# Patient Record
Sex: Female | Born: 1970 | Race: White | Hispanic: No | Marital: Married | Smoking: Never smoker
Health system: Southern US, Community
[De-identification: ages and names within clinical notes are randomized; demographics above are authoritative.]

## PROBLEM LIST (undated history)

## (undated) DIAGNOSIS — Z780 Asymptomatic menopausal state: Secondary | ICD-10-CM

## (undated) DIAGNOSIS — Z9889 Other specified postprocedural states: Secondary | ICD-10-CM

## (undated) DIAGNOSIS — E7800 Pure hypercholesterolemia, unspecified: Secondary | ICD-10-CM

## (undated) DIAGNOSIS — R87629 Unspecified abnormal cytological findings in specimens from vagina: Secondary | ICD-10-CM

## (undated) DIAGNOSIS — T8859XA Other complications of anesthesia, initial encounter: Secondary | ICD-10-CM

## (undated) HISTORY — PX: BREAST EXCISIONAL BIOPSY: SUR124

## (undated) HISTORY — PX: LEEP: SHX91

## (undated) HISTORY — DX: Pure hypercholesterolemia, unspecified: E78.00

## (undated) HISTORY — DX: Asymptomatic menopausal state: Z78.0

## (undated) HISTORY — DX: Unspecified abnormal cytological findings in specimens from vagina: R87.629

## (undated) HISTORY — PX: BREAST BIOPSY: SHX20

---

## 2000-07-21 HISTORY — PX: TONSILLECTOMY: SUR1361

## 2002-07-21 HISTORY — PX: ABDOMINAL HYSTERECTOMY: SHX81

## 2004-05-15 ENCOUNTER — Ambulatory Visit: Payer: Self-pay | Admitting: Otolaryngology

## 2005-12-09 ENCOUNTER — Ambulatory Visit: Payer: Self-pay | Admitting: Obstetrics and Gynecology

## 2006-05-06 ENCOUNTER — Ambulatory Visit: Payer: Self-pay | Admitting: Unknown Physician Specialty

## 2006-05-15 ENCOUNTER — Ambulatory Visit: Payer: Self-pay | Admitting: Unknown Physician Specialty

## 2007-11-09 ENCOUNTER — Ambulatory Visit: Payer: Self-pay | Admitting: Obstetrics and Gynecology

## 2010-08-08 ENCOUNTER — Ambulatory Visit: Payer: Self-pay | Admitting: Obstetrics and Gynecology

## 2011-09-19 ENCOUNTER — Ambulatory Visit: Payer: Self-pay | Admitting: Obstetrics and Gynecology

## 2013-03-11 ENCOUNTER — Ambulatory Visit: Payer: Self-pay | Admitting: Obstetrics and Gynecology

## 2014-06-30 LAB — HM PAP SMEAR

## 2014-07-10 ENCOUNTER — Ambulatory Visit: Payer: Self-pay | Admitting: Obstetrics and Gynecology

## 2015-07-02 ENCOUNTER — Encounter: Payer: Self-pay | Admitting: *Deleted

## 2015-07-04 ENCOUNTER — Other Ambulatory Visit: Payer: Self-pay | Admitting: Physician Assistant

## 2015-07-04 DIAGNOSIS — K594 Anal spasm: Secondary | ICD-10-CM

## 2015-07-04 DIAGNOSIS — R109 Unspecified abdominal pain: Secondary | ICD-10-CM

## 2015-07-06 ENCOUNTER — Encounter: Payer: Self-pay | Admitting: Obstetrics and Gynecology

## 2015-07-06 ENCOUNTER — Ambulatory Visit: Payer: 59

## 2015-07-12 ENCOUNTER — Ambulatory Visit: Admission: RE | Admit: 2015-07-12 | Payer: 59 | Source: Ambulatory Visit

## 2015-07-19 ENCOUNTER — Ambulatory Visit: Admission: RE | Admit: 2015-07-19 | Payer: 59 | Source: Ambulatory Visit

## 2015-07-20 ENCOUNTER — Encounter: Payer: Self-pay | Admitting: Obstetrics and Gynecology

## 2015-07-20 ENCOUNTER — Ambulatory Visit (INDEPENDENT_AMBULATORY_CARE_PROVIDER_SITE_OTHER): Payer: 59 | Admitting: Obstetrics and Gynecology

## 2015-07-20 ENCOUNTER — Telehealth: Payer: Self-pay | Admitting: Obstetrics and Gynecology

## 2015-07-20 VITALS — BP 116/72 | HR 70 | Ht 61.0 in | Wt 143.8 lb

## 2015-07-20 DIAGNOSIS — Z01419 Encounter for gynecological examination (general) (routine) without abnormal findings: Secondary | ICD-10-CM | POA: Diagnosis not present

## 2015-07-20 MED ORDER — ESTRADIOL ACETATE 0.05 MG/24HR VA RING: 1.0000 | VAGINAL_RING | Freq: Once | VAGINAL | Status: DC

## 2015-07-20 MED ORDER — SIMVASTATIN 20 MG PO TABS: 20.0000 mg | ORAL_TABLET | Freq: Every day | ORAL | Status: DC

## 2015-07-20 NOTE — Telephone Encounter (Signed)
FORGOT TO ASK U A QUESTION WHEN SHE WAS HERE. Amanda Adams 4 TUBES OF BLOOD AND SHE WANTS TO SEE IF ITS TOO LATE TO SEE IF SHE HAS ARTHRITIS.

## 2015-07-20 NOTE — Telephone Encounter (Signed)
She also forgot to talk to you about she feels like she is getting adult ADHD, she would like referral to [facility]

## 2015-07-20 NOTE — Progress Notes (Signed)
Subjective:   Amanda Adams is a 44 y.o. G37P0 Caucasian female here for a routine well-woman exam.  No LMP recorded. Patient has had a hysterectomy.    Current complaints: none PCP: me       Does need labs  Social History: Sexual: heterosexual Marital Status: married Living situation: with spouse Occupation: unknown occupation Tobacco/alcohol: no tobacco use Illicit drugs: no history of illicit drug use  The following portions of the patient's history were reviewed and updated as appropriate: allergies, current medications, past family history, past medical history, past social history, past surgical history and problem list.  Past Medical History Past Medical History  Diagnosis Date  . Vaginal Pap smear, abnormal   . Postmenopausal   . High cholesterol     Past Surgical History Past Surgical History  Procedure Laterality Date  . Abdominal hysterectomy  2004  . Tonsillectomy  2002  . Leep    . Cesarean section      Gynecologic History G2P0  No LMP recorded. Patient has had a hysterectomy. Contraception: post menopausal status Last Pap: 2015. Results were: normal Last mammogram: 2015. Results were: normal  Obstetric History OB History  Gravida Para Term Preterm AB SAB TAB Ectopic Multiple Living  2         2    # Outcome Date GA Lbr Len/2nd Weight Sex Delivery Anes PTL Lv  2 Gravida 1997    F CS-Unspec   Y  1 Gravida 1995    M CS-Unspec   Y      Current Medications No current outpatient prescriptions on file prior to visit.   No current facility-administered medications on file prior to visit.    Review of Systems Patient denies any headaches, blurred vision, shortness of breath, chest pain, abdominal pain, problems with bowel movements, urination, or intercourse.  Objective:  BP 116/72 mmHg  Pulse 70  Ht 5\' 1"  (1.549 m)  Wt 143 lb 12.8 oz (65.227 kg)  BMI 27.18 kg/m2 Physical Exam  General:  Well developed, well nourished, no acute distress. She is  alert and oriented x3. Skin:  Warm and dry Neck:  Midline trachea, no thyromegaly or nodules Cardiovascular: Regular rate and rhythm, no murmur heard Lungs:  Effort normal, all lung fields clear to auscultation bilaterally Breasts:  No dominant palpable mass, retraction, or nipple discharge Abdomen:  Soft, non tender, no hepatosplenomegaly or masses Pelvic:  External genitalia is normal in appearance.  The vagina is normal in appearance. The cervix is bulbous, no CMT.  Thin prep pap is not done . Uterus is felt to be normal size, shape, and contour.  No adnexal masses or tenderness noted. Extremities:  No swelling or varicosities noted Psych:  She has a normal mood and affect  Assessment:   Healthy well-woman exam Hyperlipidemia PMHRT fatigue  Plan:  Labs obtained F/U 1 year for AE, or sooner if needed Mammogram scheduled  Corydon Schweiss Rockney Ghee, CNM

## 2015-07-20 NOTE — Patient Instructions (Signed)
  Place annual gynecologic exam patient instructions here.  Thank you for enrolling in [facility]. Please follow the instructions below to securely access your online medical record. MyChart allows you to send messages to your doctor, view your test results, manage appointments, and more.   How Do I Sign Up? 1. In your Internet browser, go to [employer] and enter [email or web address]. 2. Click on the Sign Up Now link in the Sign In box. You will see the New Member Sign Up page. 3. Enter your MyChart Access Code exactly as it appears below. You will not need to use this code after you've completed the sign-up process. If you do not sign up before the expiration date, you must request a new code.  MyChart Access Code: [email or web address] Expires: 09/01/2015  2:00 PM  4. Enter your Social Security Number ([id number]) and Date of Birth (mm/dd/yyyy) as indicated and click Submit. You will be taken to the next sign-up page. 5. Create a MyChart ID. This will be your MyChart login ID and cannot be changed, so think of one that is secure and easy to remember. 6. Create a MyChart password. You can change your password at any time. 7. Enter your Password Reset Question and Answer. This can be used at a later time if you forget your password.  8. Enter your e-mail address. You will receive e-mail notification when new information is available in [place]. 9. Click Sign Up. You can now view your medical record.   Additional Information Remember, MyChart is NOT to be used for urgent needs. For medical emergencies, dial 911.

## 2015-07-20 NOTE — Telephone Encounter (Signed)
pls advise

## 2015-07-20 NOTE — Telephone Encounter (Signed)
Check with Amy to see if she has enough to add RA factor?

## 2015-07-21 LAB — CBC
HEMOGLOBIN: 12.6 g/dL (ref 11.1–15.9)
Hematocrit: 37.4 % (ref 34.0–46.6)
MCH: 29.4 pg (ref 26.6–33.0)
MCHC: 33.7 g/dL (ref 31.5–35.7)
MCV: 87 fL (ref 79–97)
Platelets: 279 10*3/uL (ref 150–379)
RBC: 4.29 x10E6/uL (ref 3.77–5.28)
RDW: 13.5 % (ref 12.3–15.4)
WBC: 4.1 10*3/uL (ref 3.4–10.8)

## 2015-07-21 LAB — NMR, LIPOPROFILE
CHOLESTEROL: 294 mg/dL — AB (ref 100–199)
HDL CHOLESTEROL BY NMR: 94 mg/dL (ref >39)
HDL Particle Number: 41.5 umol/L (ref >=30.5)
LDL PARTICLE NUMBER: 1561 nmol/L — AB (ref <1000)
LDL SIZE: 22.3 nm (ref >20.5)
LDL-C: 185 mg/dL — AB (ref 0–99)
LP-IR Score: <25 (ref <=45)
Small LDL Particle Number: <90 nmol/L (ref <=527)
TRIGLYCERIDES BY NMR: 76 mg/dL (ref 0–149)

## 2015-07-21 LAB — THYROID PANEL WITH TSH
Free Thyroxine Index: 2.1 (ref 1.2–4.9)
T3 Uptake Ratio: 31 % (ref 24–39)
T4, Total: 6.7 ug/dL (ref 4.5–12.0)
TSH: 2.23 u[IU]/mL (ref 0.450–4.500)

## 2015-07-21 LAB — VITAMIN B12: Vitamin B-12: 237 pg/mL (ref 211–946)

## 2015-07-21 LAB — RHEUMATOID FACTOR

## 2015-07-21 LAB — VITAMIN D 25 HYDROXY (VIT D DEFICIENCY, FRACTURES): Vit D, 25-Hydroxy: 36 ng/mL (ref 30.0–100.0)

## 2015-07-21 LAB — IRON: Iron: 94 ug/dL (ref 27–159)

## 2015-07-25 ENCOUNTER — Other Ambulatory Visit: Payer: Self-pay | Admitting: Obstetrics and Gynecology

## 2015-07-25 DIAGNOSIS — E785 Hyperlipidemia, unspecified: Secondary | ICD-10-CM

## 2015-07-25 MED ORDER — SIMVASTATIN 40 MG PO TABS: 20.0000 mg | ORAL_TABLET | Freq: Every day | ORAL | Status: DC

## 2015-07-25 MED ORDER — RED YEAST RICE 600 MG PO TABS: 1.0000 | ORAL_TABLET | Freq: Every morning | ORAL | Status: DC

## 2015-07-25 MED ORDER — OMEGA 3 1000 MG PO CAPS: 1.0000 | ORAL_CAPSULE | Freq: Every morning | ORAL | Status: DC

## 2015-07-25 MED ORDER — GARLIC 1000 MG PO CAPS: 1.0000 | ORAL_CAPSULE | Freq: Every morning | ORAL | Status: DC

## 2015-07-25 NOTE — Telephone Encounter (Signed)
Can you enter referral into epic.Please.Thanks

## 2015-07-25 NOTE — Telephone Encounter (Signed)
Please send referral to [facility] as patient requested

## 2015-07-31 NOTE — Telephone Encounter (Signed)
Mailed pt copy of all of her lab results

## 2015-07-31 NOTE — Telephone Encounter (Signed)
-----   Message from [place], [place] sent at 07/25/2015 10:44 AM EST ----- Please let her know all labs were normal except cholesterol- it is actually higher  I sent in a new dose on cholesterol medication and also sent in rx for OTC red yeast rice, Omega 3 & garlic tablets- to take one of each daily, studies have shown it works well to reduce cholesterol.  i want to recheck her levels fasting in three months, order is in computer.

## 2015-08-07 ENCOUNTER — Other Ambulatory Visit: Payer: Self-pay | Admitting: Obstetrics and Gynecology

## 2015-08-07 DIAGNOSIS — R41840 Attention and concentration deficit: Secondary | ICD-10-CM | POA: Insufficient documentation

## 2015-08-07 NOTE — Telephone Encounter (Signed)
Referral placed today

## 2015-10-07 DIAGNOSIS — C539 Malignant neoplasm of cervix uteri, unspecified: Secondary | ICD-10-CM | POA: Insufficient documentation

## 2015-10-07 DIAGNOSIS — B009 Herpesviral infection, unspecified: Secondary | ICD-10-CM | POA: Insufficient documentation

## 2016-03-13 ENCOUNTER — Other Ambulatory Visit: Payer: Self-pay | Admitting: Obstetrics and Gynecology

## 2016-03-13 ENCOUNTER — Ambulatory Visit
Admission: RE | Admit: 2016-03-13 | Discharge: 2016-03-13 | Disposition: A | Discharge status: Home / Self Care | Payer: 59 | Source: Ambulatory Visit | Attending: Obstetrics and Gynecology | Admitting: Obstetrics and Gynecology

## 2016-03-13 DIAGNOSIS — Z01419 Encounter for gynecological examination (general) (routine) without abnormal findings: Secondary | ICD-10-CM

## 2016-03-13 DIAGNOSIS — Z1231 Encounter for screening mammogram for malignant neoplasm of breast: Secondary | ICD-10-CM | POA: Insufficient documentation

## 2016-03-21 ENCOUNTER — Telehealth: Payer: Self-pay | Admitting: Obstetrics and Gynecology

## 2016-03-21 NOTE — Telephone Encounter (Signed)
Question about mammogram results she got in the mail.

## 2016-03-25 NOTE — Telephone Encounter (Signed)
Spoke with pt about mammo

## 2016-07-15 ENCOUNTER — Other Ambulatory Visit: Payer: Self-pay | Admitting: Obstetrics and Gynecology

## 2016-07-22 ENCOUNTER — Ambulatory Visit (INDEPENDENT_AMBULATORY_CARE_PROVIDER_SITE_OTHER): Payer: 59 | Admitting: Obstetrics and Gynecology

## 2016-07-22 ENCOUNTER — Encounter: Payer: Self-pay | Admitting: Obstetrics and Gynecology

## 2016-07-22 VITALS — BP 92/64 | HR 75 | Ht 61.0 in | Wt 147.7 lb

## 2016-07-22 DIAGNOSIS — E785 Hyperlipidemia, unspecified: Secondary | ICD-10-CM

## 2016-07-22 DIAGNOSIS — Z01419 Encounter for gynecological examination (general) (routine) without abnormal findings: Secondary | ICD-10-CM

## 2016-07-22 NOTE — Progress Notes (Signed)
Subjective:   Amanda Adams is a 46 y.o. G73P0 Caucasian female here for a routine well-woman exam.  No LMP recorded. Patient has had a hysterectomy.    Current complaints: doing well PCP: Gauger       doesn't desire labs  Social History: Sexual: heterosexual Marital Status: married Living situation: with family Occupation: self-employed Tobacco/alcohol: no tobacco use Illicit drugs: no history of illicit drug use  The following portions of the patient's history were reviewed and updated as appropriate: allergies, current medications, past family history, past medical history, past social history, past surgical history and problem list.  Past Medical History Past Medical History:  Diagnosis Date  . High cholesterol   . Postmenopausal   . Vaginal Pap smear, abnormal     Past Surgical History Past Surgical History:  Procedure Laterality Date  . ABDOMINAL HYSTERECTOMY  2004  . BREAST BIOPSY Right    negative years ago  . CESAREAN SECTION    . LEEP    . TONSILLECTOMY  2002    Gynecologic History G2P0  No LMP recorded. Patient has had a hysterectomy. Contraception: status post hysterectomy Last Pap: 2015. Results were: normal Last mammogram: 2016. Results were: normal   Obstetric History OB History  Gravida Para Term Preterm AB Living  2         2  SAB TAB Ectopic Multiple Live Births          2    # Outcome Date GA Lbr Len/2nd Weight Sex Delivery Anes PTL Lv  2 Gravida 1997    F CS-Unspec   LIV  1 Gravida 1995    M CS-Unspec   LIV      Current Medications Current Outpatient Prescriptions on File Prior to Visit  Medication Sig Dispense Refill  . simvastatin (ZOCOR) 40 MG tablet Take 0.5 tablets (20 mg total) by mouth daily. 90 tablet 4  . Garlic 123XX123 MG CAPS Take 1 capsule (1,000 mg total) by mouth every morning. (Patient not taking: Reported on 07/22/2016) 120 capsule 4  . Omega 3 1000 MG CAPS Take 1 capsule (1,000 mg total) by mouth every morning. (Patient not  taking: Reported on 07/22/2016) 120 each 4  . Red Yeast Rice 600 MG TABS Take 1 tablet (600 mg total) by mouth every morning. (Patient not taking: Reported on 07/22/2016) 120 tablet 4   No current facility-administered medications on file prior to visit.     Review of Systems Patient denies any headaches, blurred vision, shortness of breath, chest pain, abdominal pain, problems with bowel movements, urination, or intercourse.  Objective:  BP 92/64   Pulse 75   Ht 5\' 1"  (1.549 m)   Wt 147 lb 11.2 oz (67 kg)   BMI 27.91 kg/m  Physical Exam  General:  Well developed, well nourished, no acute distress. She is alert and oriented x3. Skin:  Warm and dry Neck:  Midline trachea, no thyromegaly or nodules Cardiovascular: Regular rate and rhythm, no murmur heard Lungs:  Effort normal, all lung fields clear to auscultation bilaterally Breasts:  No dominant palpable mass, retraction, or nipple discharge Abdomen:  Soft, non tender, no hepatosplenomegaly or masses Pelvic:  External genitalia is normal in appearance.  The vagina is normal in appearance. The cervix is bulbous, no CMT.  Thin prep pap is not done  . Uterus is .  No adnexal masses or tenderness. Extremities:  No swelling or varicosities noted Psych:  She has a normal mood and affect  Assessment:  Healthy well-woman exam hyperlipidemia  Plan:   F/U 1 year for AE, or sooner if needed Mammogram ordered  Tera Pellicane Rockney Ghee, CNM

## 2016-09-03 DIAGNOSIS — D239 Other benign neoplasm of skin, unspecified: Secondary | ICD-10-CM | POA: Diagnosis not present

## 2017-04-06 ENCOUNTER — Ambulatory Visit
Admission: RE | Admit: 2017-04-06 | Discharge: 2017-04-06 | Disposition: A | Discharge status: Home / Self Care | Payer: 59 | Source: Ambulatory Visit | Attending: Obstetrics and Gynecology | Admitting: Obstetrics and Gynecology

## 2017-04-06 DIAGNOSIS — Z01419 Encounter for gynecological examination (general) (routine) without abnormal findings: Secondary | ICD-10-CM

## 2017-04-06 DIAGNOSIS — Z1231 Encounter for screening mammogram for malignant neoplasm of breast: Secondary | ICD-10-CM | POA: Insufficient documentation

## 2017-04-13 ENCOUNTER — Telehealth: Payer: Self-pay | Admitting: Obstetrics and Gynecology

## 2017-04-13 NOTE — Telephone Encounter (Signed)
Notified pt. 

## 2017-04-13 NOTE — Telephone Encounter (Signed)
Patient called requesting mammo results

## 2017-07-16 ENCOUNTER — Other Ambulatory Visit: Payer: Self-pay | Admitting: Obstetrics and Gynecology

## 2017-07-16 ENCOUNTER — Telehealth: Payer: Self-pay | Admitting: Obstetrics and Gynecology

## 2017-07-16 NOTE — Telephone Encounter (Signed)
The patient called and stated that she would like to have her prescription Estradiol Acetate ([place]) 0.05 MG/24HR RING refilled before her Annual exam on 07/24/17. No other information was disclosed. Please advise. The patient would like a call back as well.

## 2017-07-16 NOTE — Telephone Encounter (Signed)
Done-ac 

## 2017-07-24 ENCOUNTER — Encounter: Payer: 59 | Admitting: Obstetrics and Gynecology

## 2017-09-03 DIAGNOSIS — L578 Other skin changes due to chronic exposure to nonionizing radiation: Secondary | ICD-10-CM | POA: Diagnosis not present

## 2017-09-03 DIAGNOSIS — Z859 Personal history of malignant neoplasm, unspecified: Secondary | ICD-10-CM | POA: Diagnosis not present

## 2017-09-03 DIAGNOSIS — D2330 Other benign neoplasm of skin of unspecified part of face: Secondary | ICD-10-CM | POA: Diagnosis not present

## 2017-10-07 ENCOUNTER — Ambulatory Visit (INDEPENDENT_AMBULATORY_CARE_PROVIDER_SITE_OTHER): Payer: 59 | Admitting: Obstetrics and Gynecology

## 2017-10-07 ENCOUNTER — Encounter: Payer: Self-pay | Admitting: Obstetrics and Gynecology

## 2017-10-07 ENCOUNTER — Other Ambulatory Visit: Payer: Self-pay | Admitting: Obstetrics and Gynecology

## 2017-10-07 VITALS — BP 108/65 | HR 65 | Ht 61.0 in | Wt 147.1 lb

## 2017-10-07 DIAGNOSIS — Z01419 Encounter for gynecological examination (general) (routine) without abnormal findings: Secondary | ICD-10-CM | POA: Diagnosis not present

## 2017-10-07 MED ORDER — ESTRADIOL ACETATE 0.05 MG/24HR VA RING: 1.0000 | VAGINAL_RING | Freq: Once | VAGINAL | 4 refills | Status: AC

## 2017-10-07 NOTE — Addendum Note (Signed)
Addended by: Keturah Barre L on: 10/07/2017 12:01 PM   Modules accepted: Orders

## 2017-10-07 NOTE — Progress Notes (Signed)
Subjective:   Amanda Adams is a 47 y.o. G53P0 Caucasian female here for a routine well-woman exam.  No LMP recorded. Patient has had a hysterectomy.    Current complaints: none PCP: Gauger       does desire labs  Social History: Sexual: heterosexual Marital Status: married Living situation: with family Occupation: self-employed Tobacco/alcohol: no tobacco use Illicit drugs: no history of illicit drug use  The following portions of the patient's history were reviewed and updated as appropriate: allergies, current medications, past family history, past medical history, past social history, past surgical history and problem list.  Past Medical History Past Medical History:  Diagnosis Date  . High cholesterol   . Postmenopausal   . Vaginal Pap smear, abnormal     Past Surgical History Past Surgical History:  Procedure Laterality Date  . ABDOMINAL HYSTERECTOMY  2004  . BREAST BIOPSY Right    negative years ago  . CESAREAN SECTION    . LEEP    . TONSILLECTOMY  2002    Gynecologic History G2P0  No LMP recorded. Patient has had a hysterectomy. Contraception: status post hysterectomy Last Pap: 2015. Results were: normal Last mammogram: 03/2017. Results were: normal   Obstetric History OB History  Gravida Para Term Preterm AB Living  2         2  SAB TAB Ectopic Multiple Live Births          2    # Outcome Date GA Lbr Len/2nd Weight Sex Delivery Anes PTL Lv  2 Gravida 1997    F CS-Unspec   LIV  1 Gravida 1995    M CS-Unspec   LIV      Current Medications Current Outpatient Medications on File Prior to Visit  Medication Sig Dispense Refill  . Estradiol Acetate (FEMRING) 0.05 MG/24HR RING Place vaginally.    . simvastatin (ZOCOR) 40 MG tablet Take 0.5 tablets (20 mg total) by mouth daily. (Patient not taking: Reported on 10/07/2017) 90 tablet 4   No current facility-administered medications on file prior to visit.     Review of Systems Patient denies any  headaches, blurred vision, shortness of breath, chest pain, abdominal pain, problems with bowel movements, urination, or intercourse.  Objective:  BP 108/65   Pulse 65   Ht 5\' 1"  (1.549 m)   Wt 147 lb 1.6 oz (66.7 kg)   BMI 27.79 kg/m  Physical Exam  General:  Well developed, well nourished, no acute distress. She is alert and oriented x3. Skin:  Warm and dry Neck:  Midline trachea, no thyromegaly or nodules Cardiovascular: Regular rate and rhythm, no murmur heard Lungs:  Effort normal, all lung fields clear to auscultation bilaterally Breasts:  No dominant palpable mass, retraction, or nipple discharge Abdomen:  Soft, non tender, no hepatosplenomegaly or masses Pelvic:  External genitalia is normal in appearance.  The vagina is normal in appearance. The cervix is bulbous, no CMT.  Thin prep pap is done with HR HPV cotesting. Uterus is felt to be normal size, shape, and contour.  No adnexal masses or tenderness noted. Extremities:  No swelling or varicosities noted Psych:  She has a normal mood and affect  Assessment:   Healthy well-woman exam ERT  Plan:  Labs obtained- will follow up accordingly. F/U 1 year for AE, or sooner if needed Mammogram ordered  Debby Clyne Rockney Ghee, CNM

## 2017-10-08 LAB — COMPREHENSIVE METABOLIC PANEL
ALBUMIN: 4.8 g/dL (ref 3.5–5.5)
ALT: 18 IU/L (ref 0–32)
AST: 25 IU/L (ref 0–40)
Albumin/Globulin Ratio: 1.9 (ref 1.2–2.2)
Alkaline Phosphatase: 59 IU/L (ref 39–117)
BILIRUBIN TOTAL: 0.7 mg/dL (ref 0.0–1.2)
BUN / CREAT RATIO: 11 (ref 9–23)
BUN: 10 mg/dL (ref 6–24)
CHLORIDE: 102 mmol/L (ref 96–106)
CO2: 24 mmol/L (ref 20–29)
CREATININE: 0.88 mg/dL (ref 0.57–1.00)
Calcium: 9.7 mg/dL (ref 8.7–10.2)
GFR calc Af Amer: 90 mL/min/{1.73_m2} (ref >59)
GFR calc non Af Amer: 78 mL/min/{1.73_m2} (ref >59)
GLUCOSE: 94 mg/dL (ref 65–99)
Globulin, Total: 2.5 g/dL (ref 1.5–4.5)
Potassium: 5.2 mmol/L (ref 3.5–5.2)
Sodium: 141 mmol/L (ref 134–144)
Total Protein: 7.3 g/dL (ref 6.0–8.5)

## 2017-10-08 LAB — CYTOLOGY - PAP

## 2017-10-08 LAB — LIPID PANEL
Chol/HDL Ratio: 3 ratio (ref 0.0–4.4)
Cholesterol, Total: 269 mg/dL — ABNORMAL HIGH (ref 100–199)
HDL: 91 mg/dL (ref >39)
LDL Calculated: 158 mg/dL — ABNORMAL HIGH (ref 0–99)
Triglycerides: 98 mg/dL (ref 0–149)
VLDL Cholesterol Cal: 20 mg/dL (ref 5–40)

## 2017-10-08 LAB — HEMOGLOBIN A1C
ESTIMATED AVERAGE GLUCOSE: 100 mg/dL
HEMOGLOBIN A1C: 5.1 % (ref 4.8–5.6)

## 2017-10-08 LAB — VITAMIN B12: Vitamin B-12: 234 pg/mL (ref 232–1245)

## 2017-10-08 LAB — VITAMIN D 25 HYDROXY (VIT D DEFICIENCY, FRACTURES): Vit D, 25-Hydroxy: 42.2 ng/mL (ref 30.0–100.0)

## 2017-10-15 ENCOUNTER — Encounter: Payer: Self-pay | Admitting: Obstetrics and Gynecology

## 2017-10-19 ENCOUNTER — Telehealth: Payer: Self-pay | Admitting: Obstetrics and Gynecology

## 2017-10-19 ENCOUNTER — Other Ambulatory Visit: Payer: Self-pay | Admitting: *Deleted

## 2017-10-19 ENCOUNTER — Ambulatory Visit (INDEPENDENT_AMBULATORY_CARE_PROVIDER_SITE_OTHER): Payer: 59 | Admitting: Obstetrics and Gynecology

## 2017-10-19 VITALS — BP 103/51 | HR 62 | Wt 146.2 lb

## 2017-10-19 DIAGNOSIS — E538 Deficiency of other specified B group vitamins: Secondary | ICD-10-CM

## 2017-10-19 MED ORDER — SIMVASTATIN 40 MG PO TABS: 20.0000 mg | ORAL_TABLET | Freq: Every day | ORAL | 4 refills | Status: DC

## 2017-10-19 NOTE — Progress Notes (Signed)
Pt is here for a B12 injection due .

## 2017-10-19 NOTE — Telephone Encounter (Signed)
The patient called and stated that she needs a refill of her medication simvastatin (ZOCOR) 40 MG tablet, No other information was disclosed. Please advise.

## 2017-10-19 NOTE — Telephone Encounter (Signed)
Done-ac 

## 2017-10-20 MED ORDER — CYANOCOBALAMIN 1000 MCG/ML IJ SOLN
1000.0000 ug | Freq: Once | INTRAMUSCULAR | Status: AC
  Administered 2017-10-19: 1000 ug via INTRAMUSCULAR

## 2017-10-27 ENCOUNTER — Encounter: Payer: Self-pay | Admitting: Obstetrics and Gynecology

## 2017-11-16 ENCOUNTER — Encounter: Payer: Self-pay | Admitting: Obstetrics and Gynecology

## 2017-11-16 ENCOUNTER — Ambulatory Visit (INDEPENDENT_AMBULATORY_CARE_PROVIDER_SITE_OTHER): Payer: 59 | Admitting: Obstetrics and Gynecology

## 2017-11-16 VITALS — BP 108/60 | HR 74 | Wt 147.8 lb

## 2017-11-16 DIAGNOSIS — D649 Anemia, unspecified: Secondary | ICD-10-CM | POA: Diagnosis not present

## 2017-11-16 MED ORDER — CYANOCOBALAMIN 1000 MCG/ML IJ SOLN
1000.0000 ug | Freq: Once | INTRAMUSCULAR | Status: AC
  Administered 2017-11-16: 1000 ug via INTRAMUSCULAR

## 2017-11-16 MED ORDER — CYANOCOBALAMIN 1000 MCG/ML IJ SOLN: 1000.0000 ug | INTRAMUSCULAR | 3 refills | Status: DC

## 2017-11-16 NOTE — Progress Notes (Signed)
Pt is here for b-12 inj, she takes this for fatigue

## 2018-01-19 ENCOUNTER — Encounter: Payer: Self-pay | Admitting: Obstetrics and Gynecology

## 2018-01-19 ENCOUNTER — Other Ambulatory Visit: Payer: Self-pay | Admitting: *Deleted

## 2018-01-19 MED ORDER — ACYCLOVIR 400 MG PO TABS: 400.0000 mg | ORAL_TABLET | Freq: Every day | ORAL | 2 refills | Status: DC

## 2018-01-19 MED ORDER — PENCICLOVIR 1 % EX CREA: 1.0000 "application " | TOPICAL_CREAM | CUTANEOUS | 2 refills | Status: AC

## 2018-02-11 ENCOUNTER — Encounter: Payer: Self-pay | Admitting: Obstetrics and Gynecology

## 2018-02-11 ENCOUNTER — Other Ambulatory Visit: Payer: Self-pay | Admitting: *Deleted

## 2018-02-11 MED ORDER — NEEDLES & SYRINGES MISC: 0 refills | Status: DC

## 2018-03-15 ENCOUNTER — Other Ambulatory Visit: Payer: Self-pay | Admitting: Obstetrics and Gynecology

## 2018-03-15 DIAGNOSIS — Z1231 Encounter for screening mammogram for malignant neoplasm of breast: Secondary | ICD-10-CM

## 2018-04-08 ENCOUNTER — Ambulatory Visit
Admission: RE | Admit: 2018-04-08 | Discharge: 2018-04-08 | Disposition: A | Discharge status: Home / Self Care | Payer: 59 | Source: Ambulatory Visit | Attending: Obstetrics and Gynecology | Admitting: Obstetrics and Gynecology

## 2018-04-08 DIAGNOSIS — Z1231 Encounter for screening mammogram for malignant neoplasm of breast: Secondary | ICD-10-CM | POA: Diagnosis present

## 2018-06-08 ENCOUNTER — Ambulatory Visit: Payer: 59 | Admitting: Podiatry

## 2018-10-11 ENCOUNTER — Telehealth: Payer: Self-pay | Admitting: Obstetrics and Gynecology

## 2018-10-12 ENCOUNTER — Encounter: Payer: 59 | Admitting: Obstetrics and Gynecology

## 2018-10-12 NOTE — Telephone Encounter (Signed)
error 

## 2018-10-15 ENCOUNTER — Encounter: Payer: 59 | Admitting: Obstetrics and Gynecology

## 2018-10-21 ENCOUNTER — Encounter: Payer: 59 | Admitting: Obstetrics and Gynecology

## 2018-10-21 ENCOUNTER — Other Ambulatory Visit: Payer: Self-pay

## 2018-10-21 MED ORDER — ESTRADIOL ACETATE 0.05 MG/24HR VA RING: 1.0000 | VAGINAL_RING | VAGINAL | 3 refills | Status: DC

## 2018-10-22 ENCOUNTER — Other Ambulatory Visit: Payer: Self-pay | Admitting: *Deleted

## 2018-10-26 ENCOUNTER — Other Ambulatory Visit: Payer: Self-pay | Admitting: *Deleted

## 2018-10-26 MED ORDER — ESTRADIOL ACETATE 0.05 MG/24HR VA RING: 1.0000 | VAGINAL_RING | VAGINAL | 3 refills | Status: DC

## 2018-11-17 ENCOUNTER — Other Ambulatory Visit: Payer: Self-pay | Admitting: Obstetrics and Gynecology

## 2018-11-17 DIAGNOSIS — D649 Anemia, unspecified: Secondary | ICD-10-CM

## 2018-12-16 ENCOUNTER — Other Ambulatory Visit: Payer: Self-pay

## 2018-12-16 ENCOUNTER — Ambulatory Visit: Payer: 59 | Admitting: Obstetrics and Gynecology

## 2018-12-16 ENCOUNTER — Encounter: Payer: Self-pay | Admitting: Obstetrics and Gynecology

## 2018-12-16 DIAGNOSIS — R3915 Urgency of urination: Secondary | ICD-10-CM

## 2018-12-16 NOTE — Progress Notes (Unsigned)
Patient called the office with c/o urgency and frequency of urination and asked if she could come in and leave a urine specimen to be sent for culture. Specimen obtained and sent to lab. Patient leave building before this writer could speak with her.

## 2018-12-22 ENCOUNTER — Other Ambulatory Visit: Payer: Self-pay | Admitting: Obstetrics and Gynecology

## 2018-12-22 LAB — URINE CULTURE

## 2018-12-22 MED ORDER — NITROFURANTOIN MONOHYD MACRO 100 MG PO CAPS: 100.0000 mg | ORAL_CAPSULE | Freq: Two times a day (BID) | ORAL | 1 refills | Status: DC

## 2019-01-12 ENCOUNTER — Telehealth: Payer: Self-pay | Admitting: Obstetrics and Gynecology

## 2019-01-12 NOTE — Telephone Encounter (Signed)
Patient called stating she was given instructions from Melody via mychart to drop off another urine specimen to follow up from her visit in may. I dont see any message stated that. Does she need to come in? Please advise. The patient is aware that you have already left for the day. Thanks

## 2019-01-13 ENCOUNTER — Encounter: Payer: Self-pay | Admitting: *Deleted

## 2019-01-13 NOTE — Telephone Encounter (Signed)
Sent pt mcm-ac

## 2019-02-08 ENCOUNTER — Other Ambulatory Visit: Payer: Self-pay

## 2019-02-08 ENCOUNTER — Telehealth: Payer: Self-pay | Admitting: Obstetrics and Gynecology

## 2019-02-08 DIAGNOSIS — E559 Vitamin D deficiency, unspecified: Secondary | ICD-10-CM

## 2019-02-08 DIAGNOSIS — Z0000 Encounter for general adult medical examination without abnormal findings: Secondary | ICD-10-CM

## 2019-02-08 DIAGNOSIS — Z13220 Encounter for screening for lipoid disorders: Secondary | ICD-10-CM

## 2019-02-08 DIAGNOSIS — D518 Other vitamin B12 deficiency anemias: Secondary | ICD-10-CM

## 2019-02-08 DIAGNOSIS — D508 Other iron deficiency anemias: Secondary | ICD-10-CM

## 2019-02-08 NOTE — Telephone Encounter (Signed)
The patient called and stated that she needs to have bloodwork done due to thinking her b-12/ Anemia is low. The patient is aware her provider and nurse are out of the office and is requesting to have a different provider put in orders for labs because she feels that something of off/not right. Please advise.

## 2019-02-09 ENCOUNTER — Other Ambulatory Visit: Payer: 59

## 2019-02-09 ENCOUNTER — Other Ambulatory Visit: Payer: Self-pay

## 2019-02-10 ENCOUNTER — Other Ambulatory Visit: Payer: 59

## 2019-02-12 LAB — COMPREHENSIVE METABOLIC PANEL
ALT: 16 IU/L (ref 0–32)
AST: 23 IU/L (ref 0–40)
Albumin/Globulin Ratio: 2 (ref 1.2–2.2)
Albumin: 4.7 g/dL (ref 3.8–4.8)
Alkaline Phosphatase: 67 IU/L (ref 39–117)
BUN/Creatinine Ratio: 16 (ref 9–23)
BUN: 13 mg/dL (ref 6–24)
Bilirubin Total: 0.6 mg/dL (ref 0.0–1.2)
CO2: 23 mmol/L (ref 20–29)
Calcium: 9.7 mg/dL (ref 8.7–10.2)
Chloride: 103 mmol/L (ref 96–106)
Creatinine, Ser: 0.81 mg/dL (ref 0.57–1.00)
GFR calc Af Amer: 99 mL/min/{1.73_m2} (ref >59)
GFR calc non Af Amer: 86 mL/min/{1.73_m2} (ref >59)
Globulin, Total: 2.4 g/dL (ref 1.5–4.5)
Glucose: 85 mg/dL (ref 65–99)
Potassium: 5.3 mmol/L — ABNORMAL HIGH (ref 3.5–5.2)
Sodium: 143 mmol/L (ref 134–144)
Total Protein: 7.1 g/dL (ref 6.0–8.5)

## 2019-02-12 LAB — CBC
Hematocrit: 38.7 % (ref 34.0–46.6)
Hemoglobin: 12.7 g/dL (ref 11.1–15.9)
MCH: 28.9 pg (ref 26.6–33.0)
MCHC: 32.8 g/dL (ref 31.5–35.7)
MCV: 88 fL (ref 79–97)
Platelets: 274 10*3/uL (ref 150–450)
RBC: 4.39 x10E6/uL (ref 3.77–5.28)
RDW: 12.7 % (ref 11.7–15.4)
WBC: 4.3 10*3/uL (ref 3.4–10.8)

## 2019-02-12 LAB — LIPID PANEL
Chol/HDL Ratio: 2.4 ratio (ref 0.0–4.4)
Cholesterol, Total: 233 mg/dL — ABNORMAL HIGH (ref 100–199)
HDL: 99 mg/dL (ref >39)
LDL Calculated: 118 mg/dL — ABNORMAL HIGH (ref 0–99)
Triglycerides: 80 mg/dL (ref 0–149)
VLDL Cholesterol Cal: 16 mg/dL (ref 5–40)

## 2019-02-12 LAB — VITAMIN D 25 HYDROXY (VIT D DEFICIENCY, FRACTURES): Vit D, 25-Hydroxy: 38.3 ng/mL (ref 30.0–100.0)

## 2019-02-12 LAB — METHYLMALONIC ACID, SERUM: Methylmalonic Acid: 172 nmol/L (ref 0–378)

## 2019-02-14 ENCOUNTER — Telehealth: Payer: Self-pay | Admitting: Obstetrics and Gynecology

## 2019-02-14 NOTE — Telephone Encounter (Signed)
Patient called to check the status of her results, Pt aware Melody is out of office until tomorrow 02/14/19. Pt requesting call back. Please advise.

## 2019-02-15 NOTE — Telephone Encounter (Signed)
MNS sent mcm

## 2019-02-25 ENCOUNTER — Encounter: Payer: Self-pay | Admitting: Obstetrics and Gynecology

## 2019-03-10 ENCOUNTER — Encounter: Payer: Self-pay | Admitting: Obstetrics and Gynecology

## 2019-03-10 ENCOUNTER — Ambulatory Visit (INDEPENDENT_AMBULATORY_CARE_PROVIDER_SITE_OTHER): Payer: 59 | Admitting: Obstetrics and Gynecology

## 2019-03-10 ENCOUNTER — Other Ambulatory Visit: Payer: Self-pay

## 2019-03-10 VITALS — BP 120/78 | HR 98 | Ht 61.0 in | Wt 137.2 lb

## 2019-03-10 DIAGNOSIS — Z01419 Encounter for gynecological examination (general) (routine) without abnormal findings: Secondary | ICD-10-CM | POA: Diagnosis not present

## 2019-03-10 DIAGNOSIS — E785 Hyperlipidemia, unspecified: Secondary | ICD-10-CM | POA: Diagnosis not present

## 2019-03-10 DIAGNOSIS — Z79890 Hormone replacement therapy (postmenopausal): Secondary | ICD-10-CM

## 2019-03-10 DIAGNOSIS — Z5181 Encounter for therapeutic drug level monitoring: Secondary | ICD-10-CM

## 2019-03-10 MED ORDER — ACYCLOVIR 400 MG PO TABS: 400.0000 mg | ORAL_TABLET | Freq: Every day | ORAL | 2 refills | Status: AC

## 2019-03-10 MED ORDER — SIMVASTATIN 40 MG PO TABS: 20.0000 mg | ORAL_TABLET | Freq: Every day | ORAL | 4 refills | Status: DC

## 2019-03-10 MED ORDER — ESTRADIOL ACETATE 0.05 MG/24HR VA RING: 1.0000 | VAGINAL_RING | VAGINAL | 3 refills | Status: DC

## 2019-03-10 NOTE — Progress Notes (Signed)
  SUBJECTIVE:  48 y.o. female for annual routine checkup with no concerns.  Pt sexually active with single female partner. No concern for STIs. No LMP on record s/p total hysterectomy.   Social History: Sexual: heterosexual Marital Status: married Living situation: with family Occupation: self-employed Tobacco/alcohol: no tobacco use Illicit drugs: no history of illicit drug use  Current Outpatient Medications  Medication Sig Dispense Refill  . acyclovir (ZOVIRAX) 400 MG tablet Take 1 tablet (400 mg total) by mouth 5 (five) times daily. 35 tablet 2  . cyanocobalamin (,VITAMIN B-12,) 1000 MCG/ML injection INJECT 1 ML (1,000 MCG TOTAL) INTO THE MUSCLE EVERY 30 (THIRTY) DAYS. 1 mL 12  . Estradiol Acetate 0.05 MG/24HR RING Place 1 each vaginally 2 (two) times a week. 24 each 3  . Needles & Syringes MISC Pt needs needles and syringes for vit b-12 inj every month 20 each 0  . penciclovir (DENAVIR) 1 % cream Apply 1 application topically every 2 (two) hours. 5 g 2  . simvastatin (ZOCOR) 40 MG tablet Take 0.5 tablets (20 mg total) by mouth daily. 90 tablet 4  . Estradiol Acetate 0.05 MG/24HR RING Place 1 each vaginally as directed. (Patient not taking: Reported on 03/10/2019) 1 each 3  . nitrofurantoin, macrocrystal-monohydrate, (MACROBID) 100 MG capsule Take 1 capsule (100 mg total) by mouth 2 (two) times daily. (Patient not taking: Reported on 03/10/2019) 14 capsule 1   No current facility-administered medications for this visit.    Allergies: Influenza vaccines  No LMP recorded. Patient has had a hysterectomy.  ROS:  Feeling well. No dyspnea or chest pain on exertion.  No abdominal pain, change in bowel habits, black or bloody stools.  No urinary tract symptoms. GYN ROS: normal menses, no abnormal bleeding, pelvic pain or discharge, no breast pain or new or enlarging lumps on self exam. No neurological complaints.  OBJECTIVE:  The patient appears well, alert, oriented x 3, in no distress. BP  120/78   Pulse 98   Ht 5\' 1"  (1.549 m)   Wt 62.2 kg   BMI 25.92 kg/m  ENT normal.  Neck supple. No adenopathy or thyromegaly. PERLA. Lungs are clear, good air entry, no wheezes, rhonchi or rales. S1 and S2 normal, no murmurs, regular rate and rhythm. Abdomen soft without tenderness, guarding, mass or organomegaly. Extremities show no edema, normal peripheral pulses. Neurological is normal, no focal findings.  BREAST EXAM: breasts appear normal, no suspicious masses, no skin or nipple changes or axillary nodes  PELVIC EXAM: normal external genitalia, vulva, vagina, and cervical cuff. Estradiol Acetate ring present on exam.   ASSESSMENT:  well woman Hx of total hysterectomy  Hyperlipidemia Estradiol Acetate Ring   PLAN:  Mammogram ordered pap smear due 2022 Return in 1 yr for AE or sooner if needed  Silvestre Mesi, [place]

## 2019-03-16 ENCOUNTER — Other Ambulatory Visit: Payer: Self-pay | Admitting: Obstetrics and Gynecology

## 2019-03-16 MED ORDER — ESTRADIOL ACETATE 0.05 MG/24HR VA RING: 1.0000 | VAGINAL_RING | Freq: Once | VAGINAL | 4 refills | Status: AC

## 2019-05-09 ENCOUNTER — Ambulatory Visit: Payer: 59

## 2019-05-31 ENCOUNTER — Telehealth: Payer: Self-pay

## 2019-05-31 NOTE — Telephone Encounter (Signed)
No.  Any provider can do the form (meaning PCP, GYN, Psych, etc).  It just asks basic questions such as if she has any mental health issues or concerns for her having a handgun (sort of the same questionnaire for daycare workers to make sure they are fit to work with children).

## 2019-05-31 NOTE — Telephone Encounter (Signed)
Dr. Marcelline Mates ,   Any thoughts on this??  I am not a mental health care provider not sure if this is something I should be doing .   Thanks,  Deneise Lever

## 2019-05-31 NOTE — Telephone Encounter (Signed)
Pt had physical 02/2019 with Melody. Pt states has a form for concealed handgun permit. It is a mental evaluation form. Pt wants to know if another provider can fill it out since Melody is no longer here.

## 2019-06-01 NOTE — Telephone Encounter (Signed)
Dr. Marcelline Mates says that we can fill it out.   Thanks,  Deneise Lever

## 2019-06-01 NOTE — Telephone Encounter (Signed)
Called and spoke with patient to advise her we would fill out form.  Patient stated she no longer needs form filled after doing some research.  Patient aware if anything changes she may call back and patient verbalized understanding.

## 2019-07-29 ENCOUNTER — Inpatient Hospital Stay: Admission: RE | Admit: 2019-07-29 | Payer: 59 | Source: Ambulatory Visit

## 2019-08-03 ENCOUNTER — Telehealth: Payer: Self-pay | Admitting: Certified Nurse Midwife

## 2019-08-03 ENCOUNTER — Telehealth: Payer: Self-pay

## 2019-08-03 NOTE — Telephone Encounter (Signed)
mychart message sent to patient

## 2019-08-03 NOTE — Telephone Encounter (Signed)
Pt called in and has uti symptoms, pt wants to know if they can drop off a urine sample. Pt is requesting a call from the nurse . Please advise

## 2019-08-05 ENCOUNTER — Telehealth: Payer: Self-pay

## 2019-08-05 ENCOUNTER — Other Ambulatory Visit (INDEPENDENT_AMBULATORY_CARE_PROVIDER_SITE_OTHER): Payer: 59

## 2019-08-05 DIAGNOSIS — R3915 Urgency of urination: Secondary | ICD-10-CM | POA: Diagnosis not present

## 2019-08-05 LAB — POCT URINALYSIS DIPSTICK
Bilirubin, UA: NEGATIVE
Blood, UA: NEGATIVE
Glucose, UA: NEGATIVE
Ketones, UA: NEGATIVE
Leukocytes, UA: NEGATIVE
Nitrite, UA: NEGATIVE
Protein, UA: NEGATIVE
Spec Grav, UA: 1.015 (ref 1.010–1.025)
Urobilinogen, UA: 0.2 E.U./dL
pH, UA: 5 (ref 5.0–8.0)

## 2019-08-07 LAB — URINE CULTURE

## 2019-08-09 NOTE — Telephone Encounter (Signed)
ERROR

## 2019-08-26 ENCOUNTER — Ambulatory Visit
Admission: RE | Admit: 2019-08-26 | Discharge: 2019-08-26 | Disposition: A | Discharge status: Home / Self Care | Payer: 59 | Source: Ambulatory Visit | Attending: Obstetrics and Gynecology | Admitting: Obstetrics and Gynecology

## 2019-08-26 DIAGNOSIS — Z01419 Encounter for gynecological examination (general) (routine) without abnormal findings: Secondary | ICD-10-CM

## 2019-08-26 DIAGNOSIS — Z1231 Encounter for screening mammogram for malignant neoplasm of breast: Secondary | ICD-10-CM | POA: Insufficient documentation

## 2019-09-30 DIAGNOSIS — H2000 Unspecified acute and subacute iridocyclitis: Secondary | ICD-10-CM | POA: Insufficient documentation

## 2019-10-06 ENCOUNTER — Other Ambulatory Visit: Payer: Self-pay | Admitting: Internal Medicine

## 2019-10-06 DIAGNOSIS — R9389 Abnormal findings on diagnostic imaging of other specified body structures: Secondary | ICD-10-CM

## 2019-10-12 ENCOUNTER — Ambulatory Visit
Admission: RE | Admit: 2019-10-12 | Discharge: 2019-10-12 | Disposition: A | Discharge status: Home / Self Care | Payer: 59 | Source: Ambulatory Visit | Attending: Internal Medicine | Admitting: Internal Medicine

## 2019-10-12 ENCOUNTER — Other Ambulatory Visit: Payer: Self-pay

## 2019-10-12 ENCOUNTER — Ambulatory Visit: Payer: 59

## 2019-10-12 DIAGNOSIS — R9389 Abnormal findings on diagnostic imaging of other specified body structures: Secondary | ICD-10-CM | POA: Diagnosis present

## 2019-10-12 MED ORDER — IOHEXOL 300 MG/ML  SOLN
75.0000 mL | Freq: Once | INTRAMUSCULAR | Status: AC | PRN
  Administered 2019-10-12: 75 mL via INTRAVENOUS

## 2019-10-18 ENCOUNTER — Ambulatory Visit: Payer: 59

## 2020-02-07 ENCOUNTER — Other Ambulatory Visit: Payer: Self-pay

## 2020-02-07 ENCOUNTER — Telehealth: Payer: Self-pay | Admitting: Certified Nurse Midwife

## 2020-02-07 DIAGNOSIS — D649 Anemia, unspecified: Secondary | ICD-10-CM

## 2020-02-07 MED ORDER — CYANOCOBALAMIN 1000 MCG/ML IJ SOLN: 1000.0000 ug | INTRAMUSCULAR | 2 refills | Status: DC

## 2020-02-07 NOTE — Telephone Encounter (Signed)
Refill sent per patient request.

## 2020-02-07 NOTE — Telephone Encounter (Signed)
Patient called in stating that she called in requesting her B-12 injections. Informed patient that I would send a message to her provider to see if that's something that can be refilled for her. Patient has an appointment 9/15 for a physical.

## 2020-04-04 ENCOUNTER — Encounter: Payer: 59 | Admitting: Certified Nurse Midwife

## 2020-04-11 ENCOUNTER — Telehealth: Payer: Self-pay

## 2020-04-11 ENCOUNTER — Telehealth: Payer: Self-pay | Admitting: Certified Nurse Midwife

## 2020-04-11 ENCOUNTER — Other Ambulatory Visit: Payer: Self-pay

## 2020-04-11 MED ORDER — FEMRING 0.05 MG/24HR VA RING: 1.0000 | VAGINAL_RING | Freq: Once | VAGINAL | 0 refills | Status: AC

## 2020-04-11 NOTE — Telephone Encounter (Signed)
Refill sent and mychart message to patient informing her.

## 2020-04-11 NOTE — Telephone Encounter (Signed)
Patient called in stating that she has an upcoming appointment with [place] on the 29th of this month and that she needs a refill on her femring prescription before then. Could you please advise?

## 2020-04-18 ENCOUNTER — Encounter: Payer: Self-pay | Admitting: Certified Nurse Midwife

## 2020-04-18 ENCOUNTER — Other Ambulatory Visit: Payer: Self-pay

## 2020-04-18 ENCOUNTER — Ambulatory Visit (INDEPENDENT_AMBULATORY_CARE_PROVIDER_SITE_OTHER): Payer: 59 | Admitting: Certified Nurse Midwife

## 2020-04-18 VITALS — BP 105/73 | HR 98 | Ht 61.0 in | Wt 134.7 lb

## 2020-04-18 DIAGNOSIS — D649 Anemia, unspecified: Secondary | ICD-10-CM | POA: Diagnosis not present

## 2020-04-18 DIAGNOSIS — Z01419 Encounter for gynecological examination (general) (routine) without abnormal findings: Secondary | ICD-10-CM | POA: Diagnosis not present

## 2020-04-18 DIAGNOSIS — E538 Deficiency of other specified B group vitamins: Secondary | ICD-10-CM

## 2020-04-18 DIAGNOSIS — Z1231 Encounter for screening mammogram for malignant neoplasm of breast: Secondary | ICD-10-CM

## 2020-04-18 MED ORDER — FEMRING 0.05 MG/24HR VA RING: 1.0000 | VAGINAL_RING | VAGINAL | 4 refills | Status: DC

## 2020-04-18 MED ORDER — CYANOCOBALAMIN 1000 MCG/ML IJ SOLN: 1000.0000 ug | INTRAMUSCULAR | 2 refills | Status: DC

## 2020-04-18 NOTE — Patient Instructions (Signed)

## 2020-04-18 NOTE — Progress Notes (Signed)
GYNECOLOGY ANNUAL PREVENTATIVE CARE ENCOUNTER NOTE  History:     Amanda Adams is a 49 y.o. G2P0 female here for a routine annual gynecologic exam.  Current complaints: none.   Denies abnormal vaginal bleeding, discharge, pelvic pain, problems with intercourse or other gynecologic concerns.     Social Relationship:Married  Living:with spouse and children Work: none Exercise:5 x wk  Smoke/Alcohol/drug QJJ:HERDEY  Gynecologic History No LMP recorded. Patient has had a hysterectomy. Contraception: status post hysterectomy Last Pap: 10/07/2017. Results were: normal with negative HPV Last mammogram: 08/2019. Results were: abnormal  Obstetric History OB History  Gravida Para Term Preterm AB Living  2         2  SAB TAB Ectopic Multiple Live Births          2    # Outcome Date GA Lbr Len/2nd Weight Sex Delivery Anes PTL Lv  2 Gravida 1997    F CS-Unspec   LIV  1 Gravida 1995    M CS-Unspec   LIV    Past Medical History:  Diagnosis Date  . High cholesterol   . Postmenopausal   . Vaginal Pap smear, abnormal     Past Surgical History:  Procedure Laterality Date  . ABDOMINAL HYSTERECTOMY  2004  . BREAST BIOPSY Right    negative years ago  . BREAST EXCISIONAL BIOPSY    . CESAREAN SECTION    . LEEP    . TONSILLECTOMY  2002    Current Outpatient Medications on File Prior to Visit  Medication Sig Dispense Refill  . acyclovir (ZOVIRAX) 400 MG tablet Take 1 tablet (400 mg total) by mouth 5 (five) times daily. 35 tablet 2  . cyanocobalamin (,VITAMIN B-12,) 1000 MCG/ML injection Inject 1 mL (1,000 mcg total) into the muscle every 30 (thirty) days. 1 mL 2  . Estradiol Acetate (FEMRING) 0.05 MG/24HR RING Place vaginally.    . Needles & Syringes MISC Pt needs needles and syringes for vit b-12 inj every month 20 each 0  . penciclovir (DENAVIR) 1 % cream Apply 1 application topically every 2 (two) hours. 5 g 2  . simvastatin (ZOCOR) 40 MG tablet Take 0.5 tablets (20 mg total) by  mouth daily. 90 tablet 4  . nitrofurantoin, macrocrystal-monohydrate, (MACROBID) 100 MG capsule Take 1 capsule (100 mg total) by mouth 2 (two) times daily. (Patient not taking: Reported on 03/10/2019) 14 capsule 1   No current facility-administered medications on file prior to visit.    Allergies  Allergen Reactions  . Influenza Vaccines     Swelling in the throat  Swelling and pain at the site  . Atorvastatin Other (See Comments)    Social History:  reports that she has never smoked. She has never used smokeless tobacco. She reports current alcohol use. She reports that she does not use drugs.  Family History  Problem Relation Age of Onset  . Heart disease Mother     The following portions of the patient's history were reviewed and updated as appropriate: allergies, current medications, past family history, past medical history, past social history, past surgical history and problem list.  Review of Systems Pertinent items noted in HPI and remainder of comprehensive ROS otherwise negative.  Physical Exam:  BP 105/73   Pulse 98   Ht 5\' 1"  (1.549 m)   Wt 134 lb 11.2 oz (61.1 kg)   BMI 25.45 kg/m  CONSTITUTIONAL: Well-developed, well-nourished female in no acute distress.  HENT:  Normocephalic, atraumatic, External right  and left ear normal. Oropharynx is clear and moist EYES: Conjunctivae and EOM are normal. Pupils are equal, round, and reactive to light. No scleral icterus.  NECK: Normal range of motion, supple, no masses.  Normal thyroid.  SKIN: Skin is warm and dry. No rash noted. Not diaphoretic. No erythema. No pallor. MUSCULOSKELETAL: Normal range of motion. No tenderness.  No cyanosis, clubbing, or edema.  2+ distal pulses. NEUROLOGIC: Alert and oriented to person, place, and time. Normal reflexes, muscle tone coordination.  PSYCHIATRIC: Normal mood and affect. Normal behavior. Normal judgment and thought content. CARDIOVASCULAR: Normal heart rate noted, regular  rhythm RESPIRATORY: Clear to auscultation bilaterally. Effort and breath sounds normal, no problems with respiration noted. BREASTS: Symmetric in size. No masses, tenderness, skin changes, nipple drainage, or lymphadenopathy bilaterally.  ABDOMEN: Soft, no distention noted.  No tenderness, rebound or guarding.  PELVIC: Normal appearing external genitalia and urethral meatus; normal appearing vaginal mucosa and vaginal cuff..  No abnormal discharge noted.  Pap smear not indicated.  no other palpable masses, no adnexal tenderness.  .   Assessment and Plan:    1. Women's annual routine gynecological examination   Pap N/a : Mammogram : ordered  Labs:vitamin B 12 Refills:femring Referral: none Routine preventative health maintenance measures emphasized. Please refer to After Visit Summary for other counseling recommendations.      Philip Aspen, CNM [facility]

## 2020-04-19 LAB — VITAMIN B12: Vitamin B-12: 470 pg/mL (ref 232–1245)

## 2020-05-30 ENCOUNTER — Ambulatory Visit: Payer: 59 | Admitting: Rheumatology

## 2020-06-07 NOTE — Progress Notes (Addendum)
Office Visit Note  Patient: Amanda Adams             Date of Birth: [birth year]           MRN: [id number]             PCP: Sallee Lange, NP Referring: Marlowe Sax* Visit Date: 06/08/2020 Occupation: _0 @  Subjective:  Iritis, positive ANA, pain in both hands.   History of Present Illness: Amanda Adams is a 49 y.o. female seen in consultation per request of her PCP for evaluation of positive ANA.  According to the patient in February 2020 she had left ear clogged and was seen by Dr. Farrel Conners ENT.  He suggested surgery which she did not follow through with.  She states few months later she developed iritis in the left eye and was seen by Dr. Ellin Mayhew, ophthalmologist.  He diagnosed her with iritis and suggested rheumatology work-up.  She was seen by rheumatologist at [facility] where she had extensive labs and was told that she had positive ANA.  She did not get a follow-up appointment with the rheumatologist.  She had no recurrence of iritis since then.  She also has history of arthritis in her hands and has constant pain in her hands.  None of the other joints are painful.  Has history of dry eyes..  She denies history of oral ulcers, nasal ulcers, malar rash, Raynaud's phenomenon, photosensitivity or lymphadenopathy.  There is no family history of autoimmune disease.  She is gravida 2, para 2, miscarriages 0.  Activities of Daily Living:  Patient reports morning stiffness for 30  minutes.   Patient Reports nocturnal pain.  Difficulty dressing/grooming: Denies Difficulty climbing stairs: Denies Difficulty getting out of chair: Denies Difficulty using hands for taps, buttons, cutlery, and/or writing: Denies  Review of Systems  Constitutional: Negative for fatigue.  HENT: Negative for mouth sores, mouth dryness and nose dryness.   Eyes: Positive for dryness. Negative for pain and itching.  Respiratory: Negative for shortness of breath and difficulty breathing.     Cardiovascular: Negative for chest pain and palpitations.  Gastrointestinal: Negative for blood in stool, constipation and diarrhea.  Endocrine: Negative for increased urination.  Genitourinary: Negative for difficulty urinating.  Musculoskeletal: Positive for arthralgias, joint pain, joint swelling and morning stiffness. Negative for myalgias, muscle tenderness and myalgias.  Skin: Negative for color change, rash and redness.  Allergic/Immunologic: Negative for susceptible to infections.  Neurological: Positive for numbness. Negative for dizziness, headaches, memory loss and weakness.  Hematological: Negative for bruising/bleeding tendency.  Psychiatric/Behavioral: Negative for confusion.    PMFS History:  Patient Active Problem List   Diagnosis Date Noted  . Hyperlipidemia 07/22/2016  . Difficulty concentrating 08/07/2015    Past Medical History:  Diagnosis Date  . High cholesterol   . Postmenopausal   . Vaginal Pap smear, abnormal     Family History  Problem Relation Age of Onset  . Heart disease Mother   . Vascular Disease Mother   . Lung cancer Mother   . Heart disease Sister   . Pancreatic cancer Brother   . Healthy Son   . Supraventricular tachycardia Daughter   . Healthy Daughter   . Healthy Daughter    Past Surgical History:  Procedure Laterality Date  . ABDOMINAL HYSTERECTOMY  2004  . BREAST BIOPSY Right    negative years ago  . BREAST EXCISIONAL BIOPSY    . CESAREAN SECTION    . LEEP    .  TONSILLECTOMY  2002   Social History   Social History Narrative  . Not on file    There is no immunization history on file for this patient.   Objective: Vital Signs: BP 111/72 (BP Location: Left Arm, Patient Position: Sitting, Cuff Size: Normal)   Pulse 77   Resp 12   Ht _0  (1.549 m)   Wt 136 lb (61.7 kg)   BMI 25.70 kg/m    Physical Exam Vitals and nursing note reviewed.  Constitutional:      Appearance: She is well-developed.  HENT:     Head:  Normocephalic and atraumatic.  Eyes:     Conjunctiva/sclera: Conjunctivae normal.  Cardiovascular:     Rate and Rhythm: Normal rate and regular rhythm.     Heart sounds: Normal heart sounds.  Pulmonary:     Effort: Pulmonary effort is normal.     Breath sounds: Normal breath sounds.  Abdominal:     General: Bowel sounds are normal.     Palpations: Abdomen is soft.  Musculoskeletal:     Cervical back: Normal range of motion.  Lymphadenopathy:     Cervical: No cervical adenopathy.  Skin:    General: Skin is warm and dry.     Capillary Refill: Capillary refill takes less than 2 seconds.  Neurological:     Mental Status: She is alert and oriented to person, place, and time.  Psychiatric:        Behavior: Behavior normal.      Musculoskeletal Exam: C-spine thoracic and lumbar spine were in good range of motion.  She had no SI joint tenderness.  Shoulder joints, elbow joints, wrist joints with good range of motion.  She has bilateral PIP and DIP thickening with no synovitis.  Hip joints, knee joints, ankles with good range of motion.  She has bilateral pes cavus and dorsal spurring.  Callus formation was noted under her MTPs.  CDAI Exam: CDAI Score: -- Patient Global: --; Provider Global: -- Swollen: --; Tender: -- Joint Exam 06/08/2020   No joint exam has been documented for this visit   There is currently no information documented on the homunculus. Go to the Rheumatology activity and complete the homunculus joint exam.  Investigation: No additional findings.  Imaging: No results found.  Recent Labs: Lab Results  Component Value Date   WBC 4.3 02/09/2019   HGB 12.7 02/09/2019   PLT 274 02/09/2019   NA 143 02/09/2019   K 5.3 (H) 02/09/2019   CL 103 02/09/2019   CO2 23 02/09/2019   GLUCOSE 85 02/09/2019   BUN 13 02/09/2019   CREATININE 0.81 02/09/2019   BILITOT 0.6 02/09/2019   ALKPHOS 67 02/09/2019   AST 23 02/09/2019   ALT 16 02/09/2019   PROT 7.1 02/09/2019    ALBUMIN 4.7 02/09/2019   CALCIUM 9.7 02/09/2019   GFRAA 99 02/09/2019   09/30/19 Speciality Comments: No specialty comments available.  Procedures:  No procedures performed Allergies: Influenza vaccines and Atorvastatin   Assessment / Plan:     Visit Diagnoses: Acute iritis of right eye -had an episode of iritis  last year which was treated with the steroid eyedrops.  She believes that  she may had one mild recurrence but no episodes since then.  I will obtain labs today.  Plan: B. burgdorfi antibodies, QuantiFERON-TB Gold Plus, Fluorescent treponemal ab(fta)-IgG-bld  Positive ANA (antinuclear antibody) - ANA+, PANCA-, Ace 42, CCP 4, CRP 1, dsDNA<1, HLA-B27-, RNP-, RF<10, Sm<0.2, scl-70-, Ro-, La-, antichromatin-, antiJo1-,  anti-centromere<0.2 -she had positive ANA in the past and also had abnormal CBC per patient.  She has no history of oral ulcers, nasal ulcers, malar rash, photosensitivity, Raynaud's phenomenon.  No nailbed capillary changes were noted.  I will obtain following labs today.  Plan: CBC with Differential/Platelet, COMPLETE METABOLIC PANEL WITH GFR, Urinalysis, Routine w reflex microscopic, Sedimentation rate, ANA, Anti-scleroderma antibody, RNP Antibody, Anti-Smith antibody, Sjogrens syndrome-A extractable nuclear antibody, Sjogrens syndrome-B extractable nuclear antibody, Anti-DNA antibody, double-stranded, C3 and C4, Beta-2 glycoprotein antibodies, Cardiolipin antibodies, IgG, IgM, IgA, Lupus Anticoagulant Eval w/Reflex  Pain in both hands -she complains of pain and discomfort in her bilateral hands.  She has DIP and PIP thickening with no synovitis.  Plan: XR Hand 2 View Right, XR Hand 2 View Left.  X-ray findings were consistent with osteoarthritis.  Pes cavus-she has bilateral pes cavus.  She also has dorsal spurring and calluses under her MTPs.  Proper fitting shoes with good arch support were discussed.  Abnormal CT of the chest - 10/12/19: few prominent bilateral  axillary nodes to calcification, suggesting prior granulomatous disease. no explanation for hilar prominence on radiograph  Difficulty concentrating  History of hyperlipidemia  Orders: Orders Placed This Encounter  Procedures  . XR Hand 2 View Right  . XR Hand 2 View Left  . CBC with Differential/Platelet  . COMPLETE METABOLIC PANEL WITH GFR  . Urinalysis, Routine w reflex microscopic  . Sedimentation rate  . ANA  . Anti-scleroderma antibody  . RNP Antibody  . Anti-Smith antibody  . Sjogrens syndrome-A extractable nuclear antibody  . Sjogrens syndrome-B extractable nuclear antibody  . Anti-DNA antibody, double-stranded  . C3 and C4  . Beta-2 glycoprotein antibodies  . Cardiolipin antibodies, IgG, IgM, IgA  . Lupus Anticoagulant Eval w/Reflex  . B. burgdorfi antibodies  . QuantiFERON-TB Gold Plus  . Fluorescent treponemal ab(fta)-IgG-bld   No orders of the defined types were placed in this encounter.   .  Follow-Up Instructions: Return for Iritis, positive ANA, osteoarthritis.   Bo Merino, MD  Note - This record has been created using Editor, commissioning.  Chart creation errors have been sought, but may not always  have been located. Such creation errors do not reflect on  the standard of medical care.

## 2020-06-08 ENCOUNTER — Other Ambulatory Visit: Payer: Self-pay

## 2020-06-08 ENCOUNTER — Encounter: Payer: Self-pay | Admitting: Rheumatology

## 2020-06-08 ENCOUNTER — Ambulatory Visit: Payer: Self-pay

## 2020-06-08 ENCOUNTER — Ambulatory Visit (INDEPENDENT_AMBULATORY_CARE_PROVIDER_SITE_OTHER): Payer: 59 | Admitting: Rheumatology

## 2020-06-08 VITALS — BP 111/72 | HR 77 | Resp 12 | Ht 61.0 in | Wt 136.0 lb

## 2020-06-08 DIAGNOSIS — R768 Other specified abnormal immunological findings in serum: Secondary | ICD-10-CM | POA: Diagnosis not present

## 2020-06-08 DIAGNOSIS — M79642 Pain in left hand: Secondary | ICD-10-CM

## 2020-06-08 DIAGNOSIS — R7689 Other specified abnormal immunological findings in serum: Secondary | ICD-10-CM

## 2020-06-08 DIAGNOSIS — H2000 Unspecified acute and subacute iridocyclitis: Secondary | ICD-10-CM

## 2020-06-08 DIAGNOSIS — Z8639 Personal history of other endocrine, nutritional and metabolic disease: Secondary | ICD-10-CM

## 2020-06-08 DIAGNOSIS — R41840 Attention and concentration deficit: Secondary | ICD-10-CM

## 2020-06-08 DIAGNOSIS — M79641 Pain in right hand: Secondary | ICD-10-CM | POA: Diagnosis not present

## 2020-06-08 DIAGNOSIS — Q667 Congenital pes cavus, unspecified foot: Secondary | ICD-10-CM | POA: Diagnosis not present

## 2020-06-08 DIAGNOSIS — R9389 Abnormal findings on diagnostic imaging of other specified body structures: Secondary | ICD-10-CM

## 2020-06-12 LAB — CBC WITH DIFFERENTIAL/PLATELET
Absolute Monocytes: 352 cells/uL (ref 200–950)
Basophils Absolute: 30 cells/uL (ref 0–200)
Basophils Relative: 0.8 %
Eosinophils Absolute: 59 cells/uL (ref 15–500)
Eosinophils Relative: 1.6 %
HCT: 38.1 % (ref 35.0–45.0)
Hemoglobin: 12.7 g/dL (ref 11.7–15.5)
Lymphs Abs: 1262 cells/uL (ref 850–3900)
MCH: 30 pg (ref 27.0–33.0)
MCHC: 33.3 g/dL (ref 32.0–36.0)
MCV: 89.9 fL (ref 80.0–100.0)
MPV: 9.8 fL (ref 7.5–12.5)
Monocytes Relative: 9.5 %
Neutro Abs: 1998 cells/uL (ref 1500–7800)
Neutrophils Relative %: 54 %
Platelets: 288 10*3/uL (ref 140–400)
RBC: 4.24 10*6/uL (ref 3.80–5.10)
RDW: 12.3 % (ref 11.0–15.0)
Total Lymphocyte: 34.1 %
WBC: 3.7 10*3/uL — ABNORMAL LOW (ref 3.8–10.8)

## 2020-06-12 LAB — URINALYSIS, ROUTINE W REFLEX MICROSCOPIC
Bilirubin Urine: NEGATIVE
Glucose, UA: NEGATIVE
Hgb urine dipstick: NEGATIVE
Ketones, ur: NEGATIVE
Leukocytes,Ua: NEGATIVE
Nitrite: NEGATIVE
Protein, ur: NEGATIVE
Specific Gravity, Urine: 1.016 (ref 1.001–1.03)
pH: 6.5 (ref 5.0–8.0)

## 2020-06-12 LAB — QUANTIFERON-TB GOLD PLUS
Mitogen-NIL: 6.91 IU/mL
NIL: 0.02 IU/mL
QuantiFERON-TB Gold Plus: NEGATIVE
TB1-NIL: 0 IU/mL
TB2-NIL: 0.01 IU/mL

## 2020-06-12 LAB — COMPLETE METABOLIC PANEL WITH GFR
AG Ratio: 1.9 (calc) (ref 1.0–2.5)
ALT: 13 U/L (ref 6–29)
AST: 20 U/L (ref 10–35)
Albumin: 4.6 g/dL (ref 3.6–5.1)
Alkaline phosphatase (APISO): 55 U/L (ref 37–153)
BUN: 18 mg/dL (ref 7–25)
CO2: 28 mmol/L (ref 20–32)
Calcium: 9.8 mg/dL (ref 8.6–10.4)
Chloride: 102 mmol/L (ref 98–110)
Creat: 0.83 mg/dL (ref 0.50–1.05)
GFR, Est African American: 95 mL/min/{1.73_m2} (ref >=60)
GFR, Est Non African American: 82 mL/min/{1.73_m2} (ref >=60)
Globulin: 2.4 g/dL (calc) (ref 1.9–3.7)
Glucose, Bld: 97 mg/dL (ref 65–99)
Potassium: 4.3 mmol/L (ref 3.5–5.3)
Sodium: 138 mmol/L (ref 135–146)
Total Bilirubin: 0.6 mg/dL (ref 0.2–1.2)
Total Protein: 7 g/dL (ref 6.1–8.1)

## 2020-06-12 LAB — BETA-2 GLYCOPROTEIN ANTIBODIES
Beta-2 Glyco 1 IgA: <2 U/mL
Beta-2 Glyco 1 IgM: <2 U/mL
Beta-2 Glyco I IgG: <2 U/mL

## 2020-06-12 LAB — SJOGRENS SYNDROME-A EXTRACTABLE NUCLEAR ANTIBODY: SSA (Ro) (ENA) Antibody, IgG: <1 AI

## 2020-06-12 LAB — B. BURGDORFI ANTIBODIES: B burgdorferi Ab IgG+IgM: <0.9 index

## 2020-06-12 LAB — LUPUS ANTICOAGULANT EVAL W/ REFLEX
PTT-LA Screen: 35 s (ref <=40)
dRVVT: 33 s (ref <=45)

## 2020-06-12 LAB — CARDIOLIPIN ANTIBODIES, IGG, IGM, IGA
Anticardiolipin IgA: <2 APL-U/mL
Anticardiolipin IgG: <2 GPL-U/mL
Anticardiolipin IgM: <2 MPL-U/mL

## 2020-06-12 LAB — ANTI-NUCLEAR AB-TITER (ANA TITER)
ANA TITER: 1:80 {titer} — ABNORMAL HIGH
ANA Titer 1: 1:80 {titer} — ABNORMAL HIGH

## 2020-06-12 LAB — SJOGRENS SYNDROME-B EXTRACTABLE NUCLEAR ANTIBODY: SSB (La) (ENA) Antibody, IgG: <1 AI

## 2020-06-12 LAB — FLUORESCENT TREPONEMAL AB(FTA)-IGG-BLD: Fluorescent Treponemal ABS: NONREACTIVE

## 2020-06-12 LAB — C3 AND C4
C3 Complement: 111 mg/dL (ref 83–193)
C4 Complement: 40 mg/dL (ref 15–57)

## 2020-06-12 LAB — SEDIMENTATION RATE: Sed Rate: 11 mm/h (ref 0–20)

## 2020-06-12 LAB — RNP ANTIBODY: Ribonucleic Protein(ENA) Antibody, IgG: <1 AI

## 2020-06-12 LAB — ANA: Anti Nuclear Antibody (ANA): POSITIVE — AB

## 2020-06-12 LAB — ANTI-SCLERODERMA ANTIBODY: Scleroderma (Scl-70) (ENA) Antibody, IgG: <1 AI

## 2020-06-12 LAB — ANTI-SMITH ANTIBODY: ENA SM Ab Ser-aCnc: <1 AI

## 2020-06-12 LAB — ANTI-DNA ANTIBODY, DOUBLE-STRANDED: ds DNA Ab: <1 IU/mL

## 2020-06-15 NOTE — Progress Notes (Signed)
Office Visit Note  Patient: Amanda Adams             Date of Birth: Oct 16, 1970           MRN: [id number]             PCP: Sallee Lange, NP Referring: Sallee Lange, * Visit Date: 06/29/2020 Occupation: _0 @  Subjective:  Recurrent iritis   History of Present Illness: AVAROSE MERVINE is a 49 y.o. 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  She states she has had 2 episodes of iritis in the last  year.  She has not had an episode of iritis in the last 6 months.  She is currently doing well.  She denies any history of joint swelling.  She has some joint stiffness due to underlying arthritis.  There is no history of oral ulcers, nasal ulcers, sicca symptoms, malar rash, photosensitivity or Raynaud's phenomenon.  She denies any lymphadenopathy.  Activities of Daily Living:  Patient reports morning stiffness for 30 minutes.   Patient Denies nocturnal pain.  Difficulty dressing/grooming: Denies Difficulty climbing stairs: Denies Difficulty getting out of chair: Denies Difficulty using hands for taps, buttons, cutlery, and/or writing: Reports  Review of Systems  Constitutional: Negative for fatigue.  HENT: Negative for mouth sores, mouth dryness and nose dryness.   Eyes: Positive for visual disturbance and dryness. Negative for pain and itching.  Respiratory: Negative for cough, hemoptysis, shortness of breath and difficulty breathing.   Cardiovascular: Negative for chest pain, palpitations and swelling in legs/feet.  Gastrointestinal: Negative for abdominal pain, blood in stool, constipation and diarrhea.  Endocrine: Negative for increased urination.  Genitourinary: Negative for painful urination.  Musculoskeletal: Positive for arthralgias, joint pain, joint swelling and morning stiffness. Negative for myalgias, muscle weakness, muscle tenderness and myalgias.  Skin: Negative for color change, rash and redness.  Allergic/Immunologic: Negative for susceptible to  infections.  Neurological: Negative for dizziness, numbness, headaches, memory loss and weakness.  Hematological: Negative for swollen glands.  Psychiatric/Behavioral: Negative for confusion and sleep disturbance.    PMFS History:  Patient Active Problem List   Diagnosis Date Noted  . Hyperlipidemia 07/22/2016  . Difficulty concentrating 08/07/2015    Past Medical History:  Diagnosis Date  . High cholesterol   . Postmenopausal   . Vaginal Pap smear, abnormal     Family History  Problem Relation Age of Onset  . Heart disease Mother   . Vascular Disease Mother   . Lung cancer Mother   . Heart disease Sister   . Pancreatic cancer Brother   . Healthy Son   . Supraventricular tachycardia Daughter   . Healthy Daughter   . Healthy Daughter    Past Surgical History:  Procedure Laterality Date  . ABDOMINAL HYSTERECTOMY  2004  . BREAST BIOPSY Right    negative years ago  . BREAST EXCISIONAL BIOPSY    . CESAREAN SECTION    . LEEP    . TONSILLECTOMY  2002   Social History   Social History Narrative  . Not on file    There is no immunization history on file for this patient.   Objective: Vital Signs: BP 113/73 (BP Location: Left Arm, Patient Position: Sitting, Cuff Size: Small)   Pulse 73   Ht 5' 1" (1.549 m)   Wt 135 lb (61.2 kg)   BMI 25.51 kg/m    Physical Exam Vitals and nursing note reviewed.  Constitutional:      Appearance: She is well-developed  and well-nourished.  HENT:     Head: Normocephalic and atraumatic.  Eyes:     Extraocular Movements: EOM normal.     Conjunctiva/sclera: Conjunctivae normal.  Cardiovascular:     Rate and Rhythm: Normal rate and regular rhythm.     Pulses: Intact distal pulses.     Heart sounds: Normal heart sounds.  Pulmonary:     Effort: Pulmonary effort is normal.     Breath sounds: Normal breath sounds.  Abdominal:     General: Bowel sounds are normal.     Palpations: Abdomen is soft.  Musculoskeletal:     Cervical  back: Normal range of motion.  Lymphadenopathy:     Cervical: No cervical adenopathy.  Skin:    General: Skin is warm and dry.     Capillary Refill: Capillary refill takes less than 2 seconds.  Neurological:     Mental Status: She is alert and oriented to person, place, and time.  Psychiatric:        Mood and Affect: Mood and affect normal.        Behavior: Behavior normal.      Musculoskeletal Exam: C-spine, thoracic and lumbar spine with good range of motion.  Shoulder joints, elbow joints, wrist joints, MCPs PIPs and DIPs with good range of motion with no synovitis.  Hip joints, knee joints, ankles, MTPs and PIPs with good range of motion with no synovitis.   CDAI Exam: CDAI Score: -- Patient Global: --; Provider Global: -- Swollen: --; Tender: -- Joint Exam 06/29/2020   No joint exam has been documented for this visit   There is currently no information documented on the homunculus. Go to the Rheumatology activity and complete the homunculus joint exam.  Investigation: No additional findings.  Imaging: XR Hand 2 View Left  Result Date: 06/08/2020 PIP and DIP narrowing was noted.  No MCP, intercarpal or radiocarpal joint space narrowing was noted.  No erosive changes were noted. Impression: These findings are consistent with osteoarthritis of the hand.  XR Hand 2 View Right  Result Date: 06/08/2020 PIP and DIP narrowing was noted.  No MCP, intercarpal or radiocarpal joint space narrowing was noted.  No erosive changes were noted. Impression: These findings are consistent with osteoarthritis of the hand.   Recent Labs: Lab Results  Component Value Date   WBC 3.7 (L) 06/08/2020   HGB 12.7 06/08/2020   PLT 288 06/08/2020   NA 138 06/08/2020   K 4.3 06/08/2020   CL 102 06/08/2020   CO2 28 06/08/2020   GLUCOSE 97 06/08/2020   BUN 18 06/08/2020   CREATININE 0.83 06/08/2020   BILITOT 0.6 06/08/2020   ALKPHOS 67 02/09/2019   AST 20 06/08/2020   ALT 13 06/08/2020    PROT 7.0 06/08/2020   ALBUMIN 4.7 02/09/2019   CALCIUM 9.8 06/08/2020   GFRAA 95 06/08/2020   QFTBGOLDPLUS NEGATIVE 06/08/2020   June 08, 2020 UA negative, TB Gold negative, FTA negative, Lyme negative, ANA 1: 80NH,NS, ENA negative, C3-C4 normal, anticardiolipin negative, beta-2 GP 1 -, lupus anticoagulant negative, ESR 11  2020-ANA+, PANCA-, Ace 42, CCP 4, CRP 1, dsDNA<1, HLA-B27-, RNP-, RF<10, Sm<0.2, scl-70-, Ro-, La-, antichromatin-, antiJo1-, anti-centromere<0.2  Speciality Comments: No specialty comments available.  Procedures:  No procedures performed Allergies: Influenza vaccines and Atorvastatin   Assessment / Plan:     Visit Diagnoses: Acute iritis of right eye - Only 1 maybe 2 episodes of iritis over the last 1 year.  The episode responded to topical  steroid drops.  All autoimmune work-up negative so far.  All the labs were discussed with the patient.  She had axillary lymph nodes on the CT scan of her chest.  Her ACE level was normal.  Today I did not feel any axillary lymph nodes on exam.  I have advised her to contact me in case she has recurrence of iritis.  I also discussed possible referral to Dr. Dwana Melena for evaluation of autoimmune eye disease.  Positive ANA (antinuclear antibody) - ANA low titer positive, ENA, complements normal.  No clinical features of autoimmune disease were noted.  Primary osteoarthritis of both hands - Clinical and radiographic findings are consistent with osteoarthritis.  Joint protection was discussed.  Pes cavus - Dorsal spurring and calluses under the MCPs were noted.  She went and got orthotics which has been helpful.  Abnormal CT of the chest - 10/12/19: few prominent bilateral axillary nodes to calcification, suggesting prior granulomatous disease.  History of hyperlipidemia  Difficulty concentrating  Orders: No orders of the defined types were placed in this encounter.  No orders of the defined types were placed in this  encounter. utes.   Follow-Up Instructions: No follow-ups on file.   Bo Merino, MD  Note - This record has been created using Editor, commissioning.  Chart creation errors have been sought, but may not always  have been located. Such creation errors do not reflect on  the standard of medical care.

## 2020-06-26 ENCOUNTER — Ambulatory Visit: Payer: 59 | Admitting: Rheumatology

## 2020-06-29 ENCOUNTER — Ambulatory Visit (INDEPENDENT_AMBULATORY_CARE_PROVIDER_SITE_OTHER): Payer: 59 | Admitting: Rheumatology

## 2020-06-29 ENCOUNTER — Encounter: Payer: Self-pay | Admitting: Rheumatology

## 2020-06-29 ENCOUNTER — Other Ambulatory Visit: Payer: Self-pay

## 2020-06-29 VITALS — BP 113/73 | HR 73 | Ht 61.0 in | Wt 135.0 lb

## 2020-06-29 DIAGNOSIS — Z8639 Personal history of other endocrine, nutritional and metabolic disease: Secondary | ICD-10-CM

## 2020-06-29 DIAGNOSIS — R9389 Abnormal findings on diagnostic imaging of other specified body structures: Secondary | ICD-10-CM

## 2020-06-29 DIAGNOSIS — M19041 Primary osteoarthritis, right hand: Secondary | ICD-10-CM

## 2020-06-29 DIAGNOSIS — Q667 Congenital pes cavus, unspecified foot: Secondary | ICD-10-CM | POA: Diagnosis not present

## 2020-06-29 DIAGNOSIS — R768 Other specified abnormal immunological findings in serum: Secondary | ICD-10-CM

## 2020-06-29 DIAGNOSIS — H2000 Unspecified acute and subacute iridocyclitis: Secondary | ICD-10-CM | POA: Diagnosis not present

## 2020-06-29 DIAGNOSIS — M19042 Primary osteoarthritis, left hand: Secondary | ICD-10-CM

## 2020-06-29 DIAGNOSIS — R41840 Attention and concentration deficit: Secondary | ICD-10-CM

## 2020-09-04 ENCOUNTER — Ambulatory Visit
Admission: RE | Admit: 2020-09-04 | Discharge: 2020-09-04 | Disposition: A | Discharge status: Home / Self Care | Payer: 59 | Source: Ambulatory Visit | Attending: Certified Nurse Midwife | Admitting: Certified Nurse Midwife

## 2020-09-04 ENCOUNTER — Other Ambulatory Visit: Payer: Self-pay

## 2020-09-04 DIAGNOSIS — Z1231 Encounter for screening mammogram for malignant neoplasm of breast: Secondary | ICD-10-CM | POA: Diagnosis not present

## 2020-09-04 DIAGNOSIS — Z01419 Encounter for gynecological examination (general) (routine) without abnormal findings: Secondary | ICD-10-CM | POA: Diagnosis present

## 2020-09-07 ENCOUNTER — Other Ambulatory Visit: Payer: Self-pay | Admitting: Certified Nurse Midwife

## 2020-09-07 DIAGNOSIS — N6489 Other specified disorders of breast: Secondary | ICD-10-CM

## 2020-09-07 DIAGNOSIS — R928 Other abnormal and inconclusive findings on diagnostic imaging of breast: Secondary | ICD-10-CM

## 2020-09-10 ENCOUNTER — Other Ambulatory Visit: Payer: Self-pay | Admitting: Certified Nurse Midwife

## 2020-09-10 DIAGNOSIS — N6489 Other specified disorders of breast: Secondary | ICD-10-CM

## 2020-09-12 ENCOUNTER — Other Ambulatory Visit: Payer: Self-pay | Admitting: Certified Nurse Midwife

## 2020-09-12 DIAGNOSIS — N6489 Other specified disorders of breast: Secondary | ICD-10-CM

## 2020-09-12 DIAGNOSIS — R599 Enlarged lymph nodes, unspecified: Secondary | ICD-10-CM

## 2020-09-12 DIAGNOSIS — R928 Other abnormal and inconclusive findings on diagnostic imaging of breast: Secondary | ICD-10-CM

## 2020-09-19 IMAGING — MG DIGITAL SCREENING BILAT W/ TOMO W/ CAD
8 series · 8 of 24 positions shown · non-contrast
Comparison: Previous exam(s).

CLINICAL DATA: Screening.

EXAM:
DIGITAL SCREENING BILATERAL MAMMOGRAM WITH TOMO AND CAD

[R MLO synth-2D]
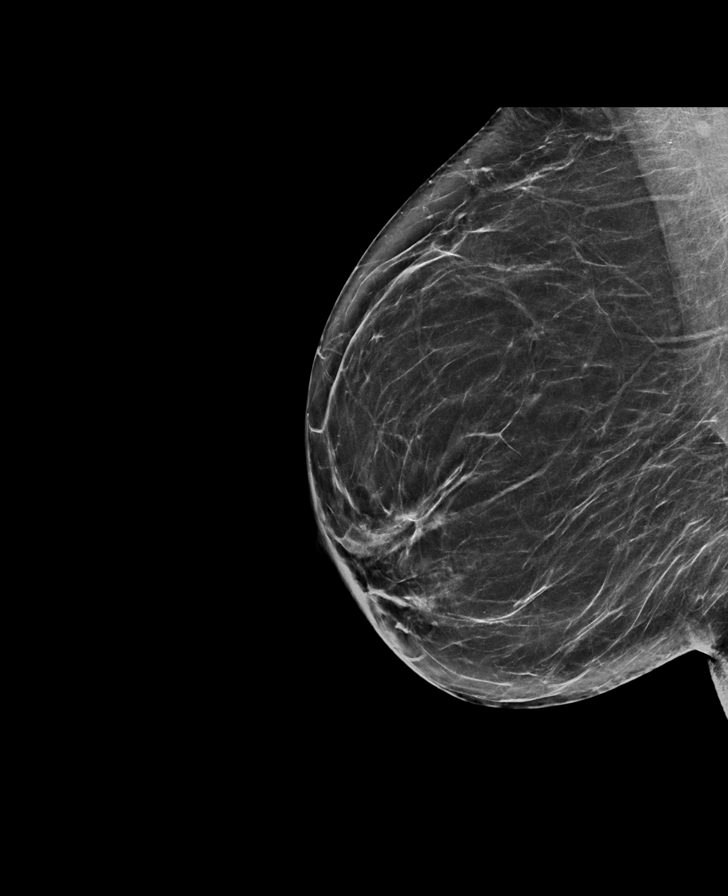

[L CC synth-2D]
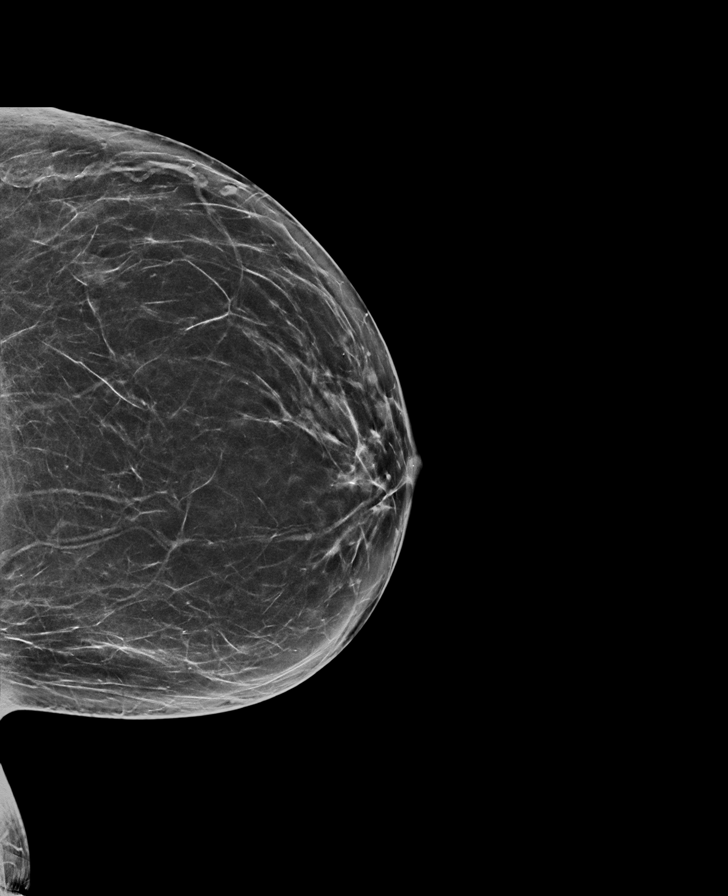

[L MLO synth-2D]
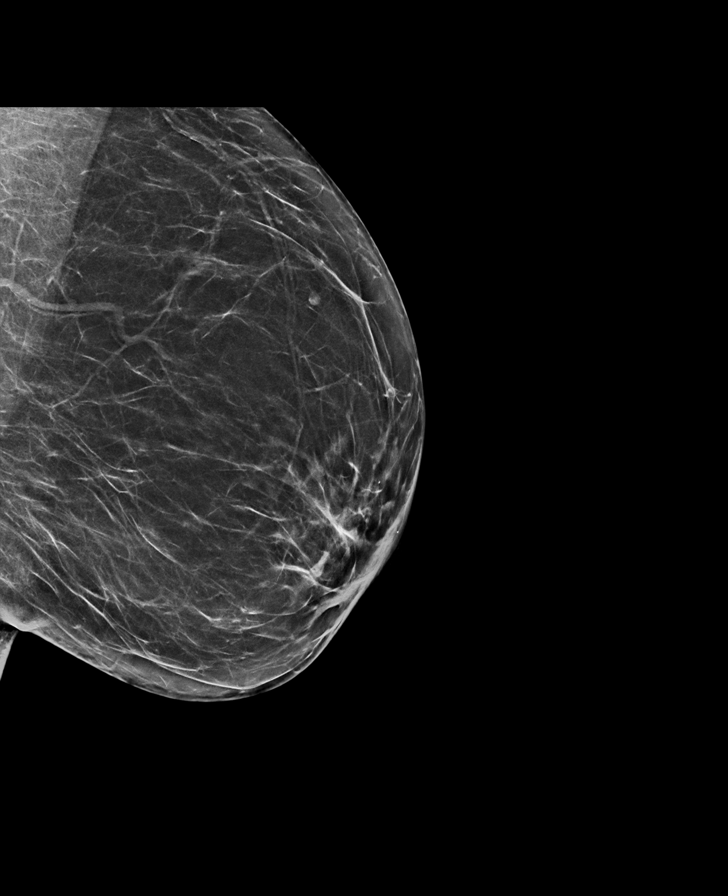

[R CC synth-2D]
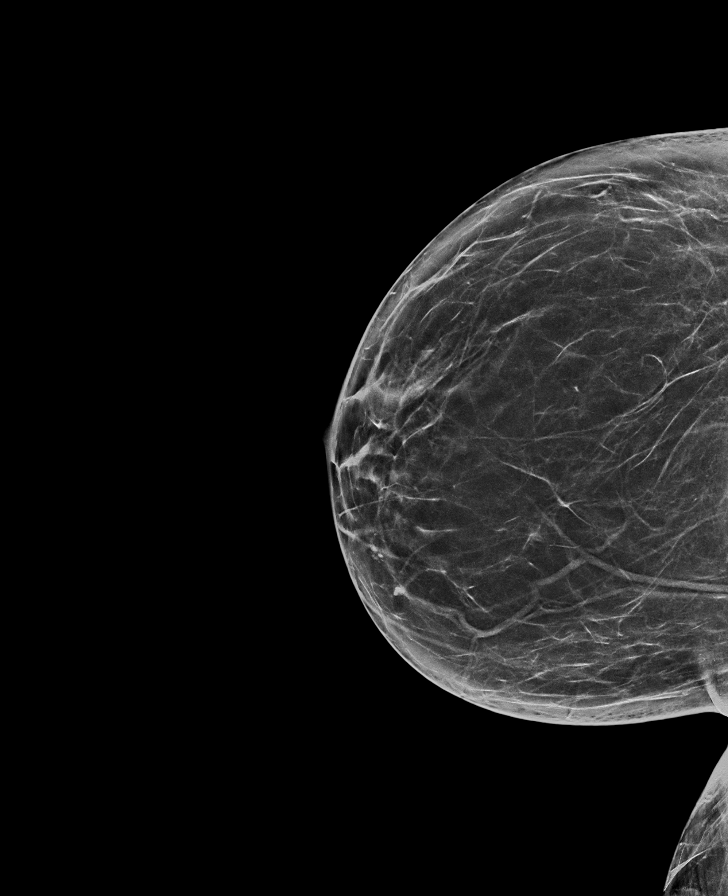

[R MLO tomo · tomo slice 37/72.0]
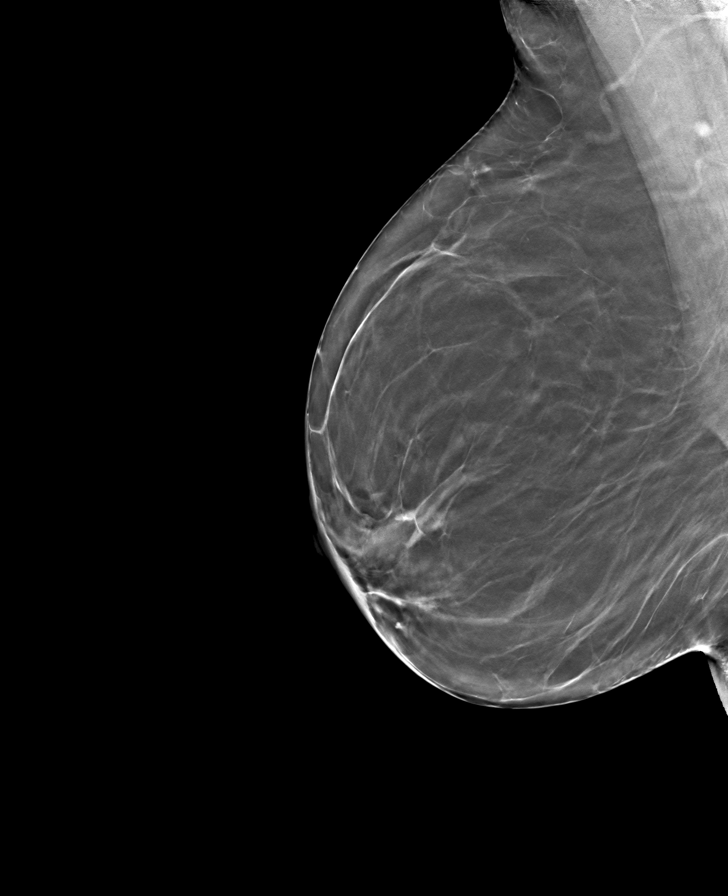

[R CC tomo · tomo slice 36/71.0]
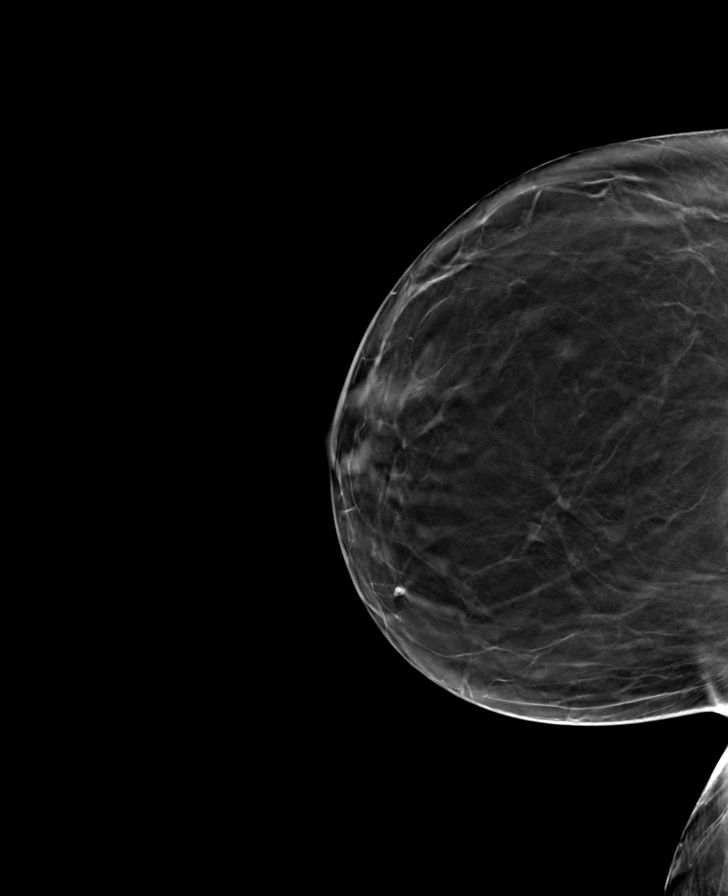

[L MLO tomo · tomo slice 35/70.0]
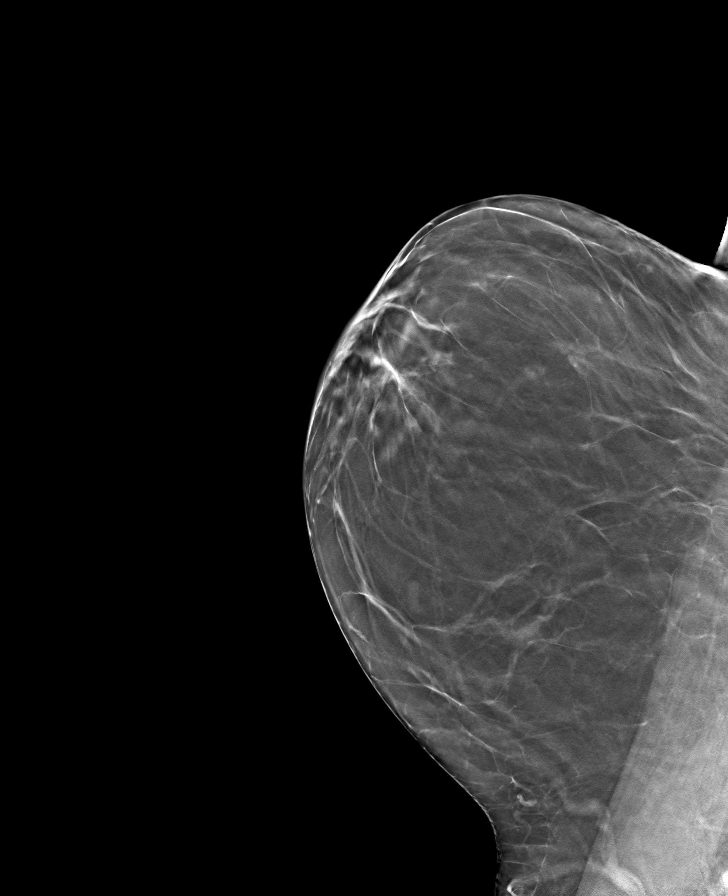

[L CC tomo · tomo slice 35/70.0]
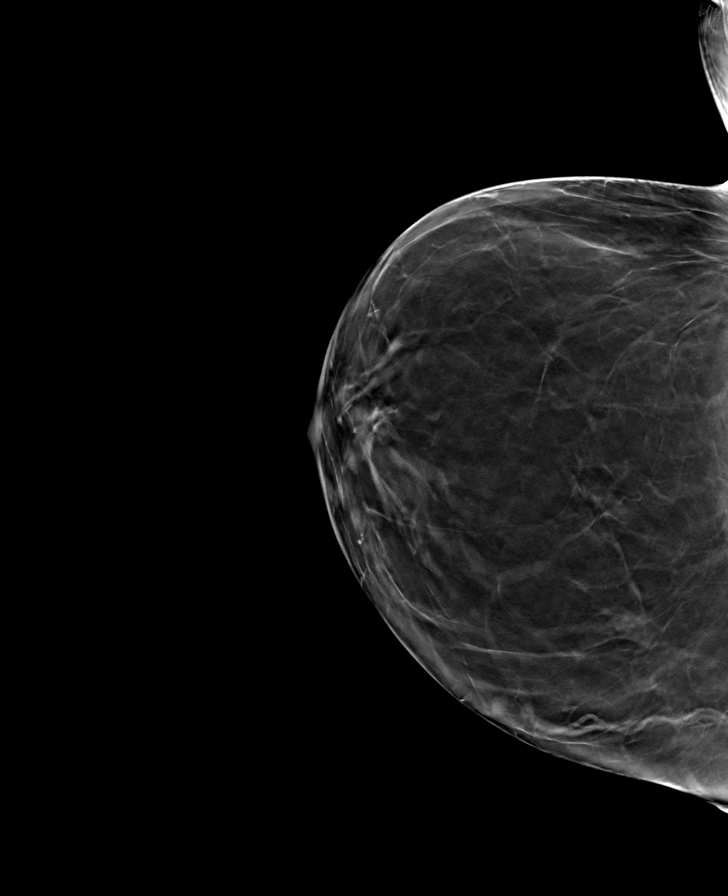

[8 of 24 positions shown; findings below may reference images not displayed]

ACR Breast Density Category b: There are scattered areas of
fibroglandular density.
FINDINGS: There are no findings suspicious for malignancy. Images were
processed with CAD.
IMPRESSION: No mammographic evidence of malignancy. A result letter of this
screening mammogram will be mailed directly to the patient.

RECOMMENDATION:
Screening mammogram in one year. (Code:[id number])

BI-RADS CATEGORY  1: Negative.

## 2020-09-21 ENCOUNTER — Ambulatory Visit
Admission: RE | Admit: 2020-09-21 | Discharge: 2020-09-21 | Disposition: A | Discharge status: Home / Self Care | Payer: 59 | Source: Ambulatory Visit | Attending: Certified Nurse Midwife | Admitting: Certified Nurse Midwife

## 2020-09-21 ENCOUNTER — Other Ambulatory Visit: Payer: Self-pay

## 2020-09-21 DIAGNOSIS — R928 Other abnormal and inconclusive findings on diagnostic imaging of breast: Secondary | ICD-10-CM | POA: Insufficient documentation

## 2020-09-21 DIAGNOSIS — R599 Enlarged lymph nodes, unspecified: Secondary | ICD-10-CM | POA: Insufficient documentation

## 2020-09-21 DIAGNOSIS — N6489 Other specified disorders of breast: Secondary | ICD-10-CM | POA: Insufficient documentation

## 2020-10-24 ENCOUNTER — Telehealth: Payer: Self-pay | Admitting: Obstetrics and Gynecology

## 2020-10-24 NOTE — Telephone Encounter (Signed)
New Message:  Pt called and stated that the cost of Femring has doubled in cost and she cant afford to pay 600.  She states that she went to their website and they have a saving card online but it is requiring that she get an ID number from the physician.  She said the cost will be as low 25 if she can get this.

## 2020-10-24 NOTE — Telephone Encounter (Signed)
Please advise. Thanks Josiel Gahm 

## 2020-10-24 NOTE — Telephone Encounter (Signed)
Called [facility] in [place] had gave pharmacist the coupon card.   89.00 for a 90 day supply. Pt aware.

## 2020-11-06 DIAGNOSIS — E538 Deficiency of other specified B group vitamins: Secondary | ICD-10-CM | POA: Insufficient documentation

## 2021-04-22 ENCOUNTER — Encounter: Payer: Self-pay | Admitting: Certified Nurse Midwife

## 2021-04-22 ENCOUNTER — Encounter: Payer: 59 | Admitting: Certified Nurse Midwife

## 2021-06-17 NOTE — Progress Notes (Deleted)
Office Visit Note  Patient: Amanda Adams             Date of Birth: January 10, 1971           MRN: [id number]             PCP: Rusty Aus, MD Referring: Sallee Lange, * Visit Date: 06/28/2021 Occupation: @GUAROCC @  Subjective:  No chief complaint on file.   History of Present Illness: Amanda Adams is a 50 y.o. female ***   Activities of Daily Living:  Patient reports morning stiffness for *** {minute/hour:19697}.   Patient {ACTIONS;DENIES/REPORTS:21021675::"Denies"} nocturnal pain.  Difficulty dressing/grooming: {ACTIONS;DENIES/REPORTS:21021675::"Denies"} Difficulty climbing stairs: {ACTIONS;DENIES/REPORTS:21021675::"Denies"} Difficulty getting out of chair: {ACTIONS;DENIES/REPORTS:21021675::"Denies"} Difficulty using hands for taps, buttons, cutlery, and/or writing: {ACTIONS;DENIES/REPORTS:21021675::"Denies"}  No Rheumatology ROS completed.   PMFS History:  Patient Active Problem List   Diagnosis Date Noted   Hyperlipidemia 07/22/2016   Difficulty concentrating 08/07/2015    Past Medical History:  Diagnosis Date   High cholesterol    Postmenopausal    Vaginal Pap smear, abnormal     Family History  Problem Relation Age of Onset   Heart disease Mother    Vascular Disease Mother    Lung cancer Mother    Heart disease Sister    Pancreatic cancer Brother    Healthy Son    Supraventricular tachycardia Daughter    Healthy Daughter    Healthy Daughter    Breast cancer Neg Hx    Past Surgical History:  Procedure Laterality Date   ABDOMINAL HYSTERECTOMY  2004   BREAST BIOPSY Right    negative years ago   BREAST EXCISIONAL BIOPSY     CESAREAN SECTION     LEEP     TONSILLECTOMY  2002   Social History   Social History Narrative   Not on file    There is no immunization history on file for this patient.   Objective: Vital Signs: There were no vitals taken for this visit.   Physical Exam   Musculoskeletal Exam: ***  CDAI Exam: CDAI Score:  -- Patient Global: --; Provider Global: -- Swollen: --; Tender: -- Joint Exam 06/28/2021   No joint exam has been documented for this visit   There is currently no information documented on the homunculus. Go to the Rheumatology activity and complete the homunculus joint exam.  Investigation: No additional findings.  Imaging: No results found.  Recent Labs: Lab Results  Component Value Date   WBC 3.7 (L) 06/08/2020   HGB 12.7 06/08/2020   PLT 288 06/08/2020   NA 138 06/08/2020   K 4.3 06/08/2020   CL 102 06/08/2020   CO2 28 06/08/2020   GLUCOSE 97 06/08/2020   BUN 18 06/08/2020   CREATININE 0.83 06/08/2020   BILITOT 0.6 06/08/2020   ALKPHOS 67 02/09/2019   AST 20 06/08/2020   ALT 13 06/08/2020   PROT 7.0 06/08/2020   ALBUMIN 4.7 02/09/2019   CALCIUM 9.8 06/08/2020   GFRAA 95 06/08/2020   QFTBGOLDPLUS NEGATIVE 06/08/2020    Speciality Comments: No specialty comments available.  Procedures:  No procedures performed Allergies: Influenza vaccines and Atorvastatin   Assessment / Plan:     Visit Diagnoses: No diagnosis found.  Orders: No orders of the defined types were placed in this encounter.  No orders of the defined types were placed in this encounter.   Face-to-face time spent with patient was *** minutes. Greater than 50% of time was spent in counseling and coordination of  care.  Follow-Up Instructions: No follow-ups on file.   Earnestine Mealing, CMA  Note - This record has been created using Editor, commissioning.  Chart creation errors have been sought, but may not always  have been located. Such creation errors do not reflect on  the standard of medical care.

## 2021-06-24 ENCOUNTER — Ambulatory Visit (INDEPENDENT_AMBULATORY_CARE_PROVIDER_SITE_OTHER): Payer: 59 | Admitting: Certified Nurse Midwife

## 2021-06-24 ENCOUNTER — Encounter: Payer: Self-pay | Admitting: Certified Nurse Midwife

## 2021-06-24 ENCOUNTER — Other Ambulatory Visit: Payer: Self-pay

## 2021-06-24 VITALS — BP 116/67 | HR 84 | Ht 61.0 in | Wt 119.8 lb

## 2021-06-24 DIAGNOSIS — Z01419 Encounter for gynecological examination (general) (routine) without abnormal findings: Secondary | ICD-10-CM

## 2021-06-24 DIAGNOSIS — Z1231 Encounter for screening mammogram for malignant neoplasm of breast: Secondary | ICD-10-CM | POA: Diagnosis not present

## 2021-06-24 MED ORDER — FEMRING 0.05 MG/24HR VA RING: 1.0000 | VAGINAL_RING | VAGINAL | 4 refills | Status: DC

## 2021-06-24 NOTE — Progress Notes (Signed)
GYNECOLOGY ANNUAL PREVENTATIVE CARE ENCOUNTER NOTE  History:     Amanda Adams is a 50 y.o. G2P0 female here for a routine annual gynecologic exam.  Current complaints: none.   Denies abnormal vaginal bleeding, discharge, pelvic pain, problems with intercourse or other gynecologic concerns.     Social Relationship: married  Living:spouse and adopted daughter ( 90 yr old) Work: none  Exercise: 5x wk Smoke/Alcohol/drug use: none  Gynecologic History No LMP recorded. Patient has had a hysterectomy. Contraception: status post hysterectomy Last Pap: 2019. Results were: normal with negative HPV Last mammogram: 09/21/2020. Results were: normal  Obstetric History OB History  Gravida Para Term Preterm AB Living  2         2  SAB IAB Ectopic Multiple Live Births          2    # Outcome Date GA Lbr Len/2nd Weight Sex Delivery Anes PTL Lv  2 Gravida 1997    F CS-Unspec   LIV  1 Gravida 1995    M CS-Unspec   LIV    Past Medical History:  Diagnosis Date   High cholesterol    Postmenopausal    Vaginal Pap smear, abnormal     Past Surgical History:  Procedure Laterality Date   ABDOMINAL HYSTERECTOMY  2004   BREAST BIOPSY Right    negative years ago   BREAST EXCISIONAL BIOPSY     CESAREAN SECTION     LEEP     TONSILLECTOMY  2002    Current Outpatient Medications on File Prior to Visit  Medication Sig Dispense Refill   acyclovir (ZOVIRAX) 400 MG tablet Take 1 tablet (400 mg total) by mouth 5 (five) times daily. (Patient taking differently: Take 400 mg by mouth as needed.) 35 tablet 2   Fexofenadine-Pseudoephedrine (ALLEGRA-D PO) Take by mouth daily.     penciclovir (DENAVIR) 1 % cream Apply 1 application topically every 2 (two) hours. (Patient taking differently: Apply 1 application topically as needed.) 5 g 2   simvastatin (ZOCOR) 20 MG tablet Take 20 mg by mouth every other day.     No current facility-administered medications on file prior to visit.    Allergies   Allergen Reactions   Influenza Vaccines     Swelling in the throat  Swelling and pain at the site   Atorvastatin Other (See Comments)    Social History:  reports that she has never smoked. She has never used smokeless tobacco. She reports current alcohol use. She reports that she does not use drugs.  Family History  Problem Relation Age of Onset   Heart disease Mother    Vascular Disease Mother    Lung cancer Mother    Heart disease Sister    Pancreatic cancer Brother    Healthy Son    Supraventricular tachycardia Daughter    Healthy Daughter    Healthy Daughter    Breast cancer Neg Hx     The following portions of the patient's history were reviewed and updated as appropriate: allergies, current medications, past family history, past medical history, past social history, past surgical history and problem list.  Review of Systems Pertinent items noted in HPI and remainder of comprehensive ROS otherwise negative.  Physical Exam:  BP 116/67   Pulse 84   Ht 5\' 1"  (1.549 m)   Wt 119 lb 12.8 oz (54.3 kg)   BMI 22.64 kg/m  CONSTITUTIONAL: Well-developed, well-nourished female in no acute distress.  HENT:  Normocephalic, atraumatic, External  right and left ear normal. Oropharynx is clear and moist EYES: Conjunctivae and EOM are normal. Pupils are equal, round, and reactive to light. No scleral icterus.  NECK: Normal range of motion, supple, no masses.  Normal thyroid.  SKIN: Skin is warm and dry. No rash noted. Not diaphoretic. No erythema. No pallor. MUSCULOSKELETAL: Normal range of motion. No tenderness.  No cyanosis, clubbing, or edema.  2+ distal pulses. NEUROLOGIC: Alert and oriented to person, place, and time. Normal reflexes, muscle tone coordination.  PSYCHIATRIC: Normal mood and affect. Normal behavior. Normal judgment and thought content. CARDIOVASCULAR: Normal heart rate noted, regular rhythm RESPIRATORY: Clear to auscultation bilaterally. Effort and breath sounds  normal, no problems with respiration noted. BREASTS: Symmetric in size. No masses, tenderness, skin changes, nipple drainage, or lymphadenopathy bilaterally.  ABDOMEN: Soft, no distention noted.  No tenderness, rebound or guarding.  PELVIC: Normal appearing external genitalia and urethral meatus; normal appearing vaginal mucosa and cervix.  No abnormal discharge noted.  Pap smear not indicated. Uterus absent, no other palpable masses, no  adnexal tenderness.  .   Assessment and Plan:    1. Well woman exam    Pap: not indicated Mammogram : ordered Labs:declines Refills: femring  Referral: none Colonoscopy: done 04/2021 removed 2 polyps repeat in 7 yrs.  Routine preventative health maintenance measures emphasized. Please refer to After Visit Summary for other counseling recommendations.      Philip Aspen, CNM [facility]

## 2021-06-28 ENCOUNTER — Ambulatory Visit: Payer: 59 | Admitting: Rheumatology

## 2021-08-13 ENCOUNTER — Other Ambulatory Visit: Payer: Self-pay

## 2021-08-13 ENCOUNTER — Encounter: Payer: Self-pay | Admitting: Certified Nurse Midwife

## 2021-08-13 ENCOUNTER — Ambulatory Visit (INDEPENDENT_AMBULATORY_CARE_PROVIDER_SITE_OTHER): Payer: 59 | Admitting: Certified Nurse Midwife

## 2021-08-13 VITALS — Temp 97.6°F | Ht 61.0 in | Wt 120.0 lb

## 2021-08-13 DIAGNOSIS — R399 Unspecified symptoms and signs involving the genitourinary system: Secondary | ICD-10-CM

## 2021-08-13 LAB — POCT URINALYSIS DIPSTICK
Bilirubin, UA: NEGATIVE
Blood, UA: NEGATIVE
Glucose, UA: NEGATIVE
Ketones, UA: NEGATIVE
Leukocytes, UA: NEGATIVE
Nitrite, UA: NEGATIVE
Protein, UA: NEGATIVE
Spec Grav, UA: <=1.005 — AB (ref 1.010–1.025)
Urobilinogen, UA: 0.2 E.U./dL
pH, UA: 7 (ref 5.0–8.0)

## 2021-08-13 NOTE — Telephone Encounter (Signed)
Per front desk, pt called and is going to come in to leave a urine sample.

## 2021-08-13 NOTE — Progress Notes (Signed)
Pt presents for urine drop off only for UTI s/x C/o urinary frequency, burning and L flank pain. Urine dip within normal limits, sent off sample for culture. Confirmed pts pharmacy UTD on file. All patient questions answered.

## 2021-08-15 ENCOUNTER — Encounter: Payer: Self-pay | Admitting: Certified Nurse Midwife

## 2021-08-15 LAB — URINE CULTURE

## 2021-08-16 ENCOUNTER — Telehealth: Payer: Self-pay | Admitting: Certified Nurse Midwife

## 2021-08-16 NOTE — Telephone Encounter (Signed)
Pt called requesting to speak with clinical staff about recent urine results for UTI and contining symptoms- burning, painful urination. Please advise.

## 2021-09-05 ENCOUNTER — Other Ambulatory Visit: Payer: Self-pay

## 2021-09-05 ENCOUNTER — Ambulatory Visit
Admission: RE | Admit: 2021-09-05 | Discharge: 2021-09-05 | Disposition: A | Discharge status: Home / Self Care | Payer: 59 | Source: Ambulatory Visit | Attending: Certified Nurse Midwife | Admitting: Certified Nurse Midwife

## 2021-09-05 DIAGNOSIS — Z1231 Encounter for screening mammogram for malignant neoplasm of breast: Secondary | ICD-10-CM | POA: Diagnosis not present

## 2021-09-05 DIAGNOSIS — Z01419 Encounter for gynecological examination (general) (routine) without abnormal findings: Secondary | ICD-10-CM | POA: Insufficient documentation

## 2021-09-06 ENCOUNTER — Telehealth: Payer: Self-pay | Admitting: Certified Nurse Midwife

## 2021-09-06 NOTE — Telephone Encounter (Signed)
Pt called and stated that she changed her pharmacy from [facility] to [facility]. When this changed occurred she said that her coupon did not flow over for her femring. She is requesting that someone send over the Bin# , PCN#, ID#, and Group# for the coupon so she can receive a discount since the medication is so high in cost. Please advise.

## 2021-09-06 NOTE — Telephone Encounter (Signed)
LM with pt with discount info

## 2021-10-16 IMAGING — MG MM DIGITAL DIAGNOSTIC UNILAT*L* W/ TOMO W/ CAD
4 series · 4 of 12 positions shown · non-contrast
Comparison: Previous exam(s).

CLINICAL DATA: Callback for LEFT breast asymmetry. Dr. also ordered
a LEFT axillary ultrasound due to CT report from October 12, 2019
reporting calcified lymph nodes consistent with sequela of prior
granulomatous infection.

EXAM:
DIGITAL DIAGNOSTIC UNILATERAL LEFT MAMMOGRAM WITH TOMOSYNTHESIS AND
CAD; ULTRASOUND LEFT BREAST LIMITED
TECHNIQUE: Left digital diagnostic mammography and breast tomosynthesis was
performed. The images were evaluated with computer-aided detection.;
Targeted ultrasound examination of the left breast was performed

[L CC synth-2D]
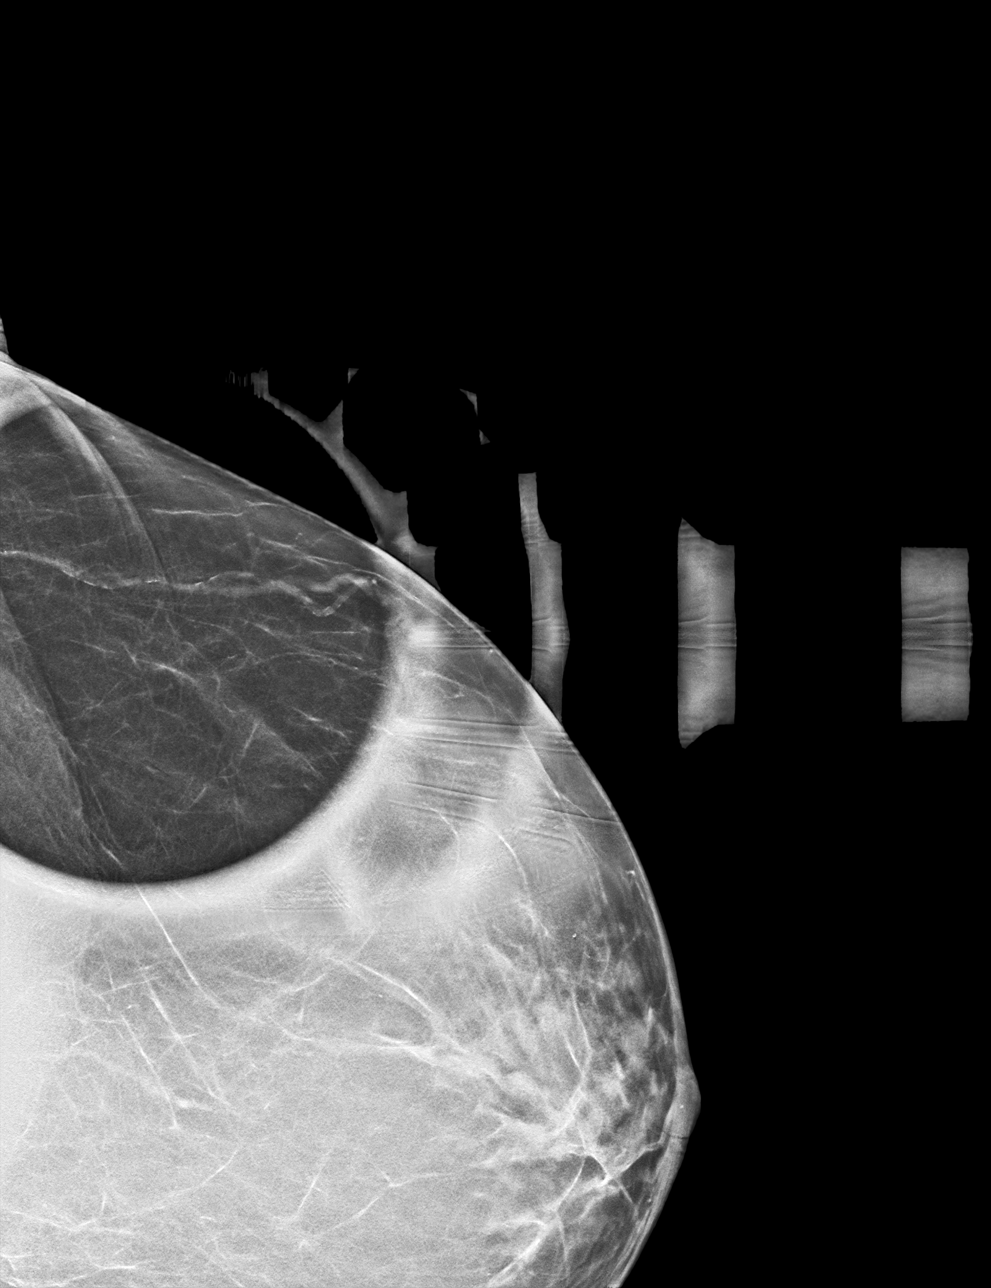

[L ML synth-2D]
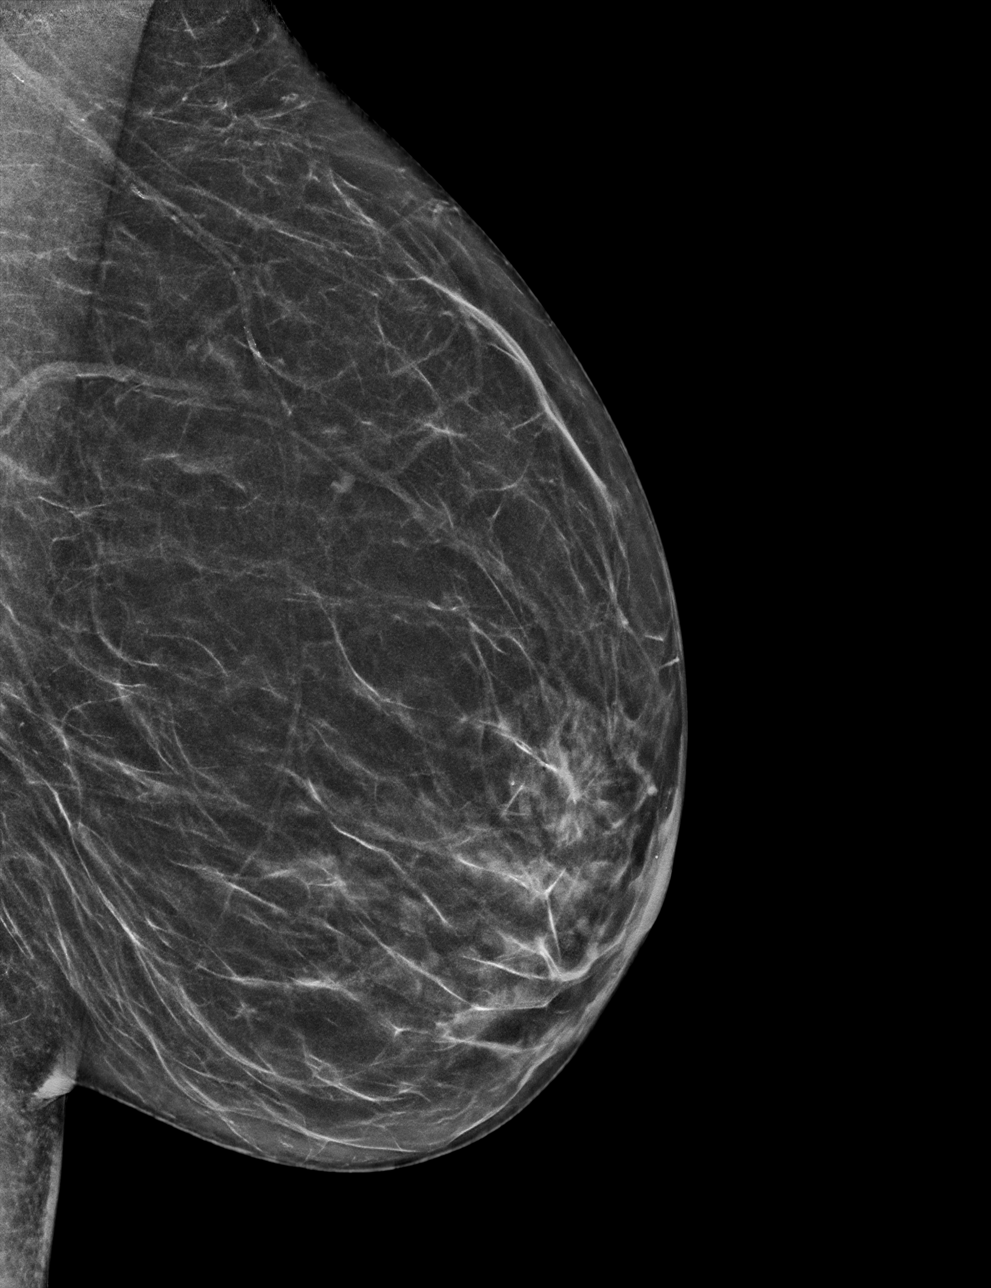

[L CC tomo · tomo slice 24/47.0]
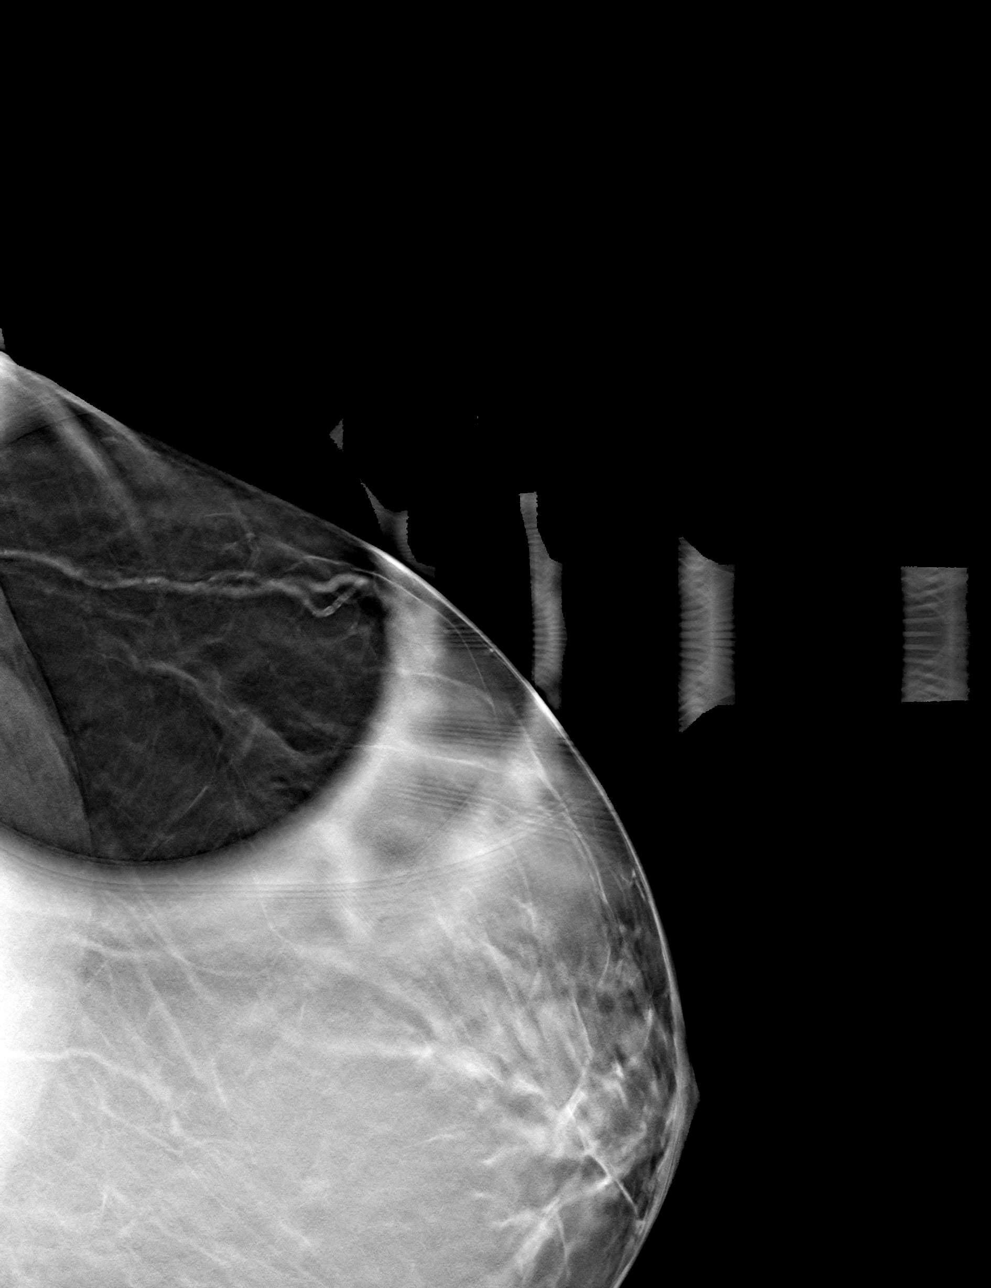

[L ML tomo · tomo slice 27/54.0]
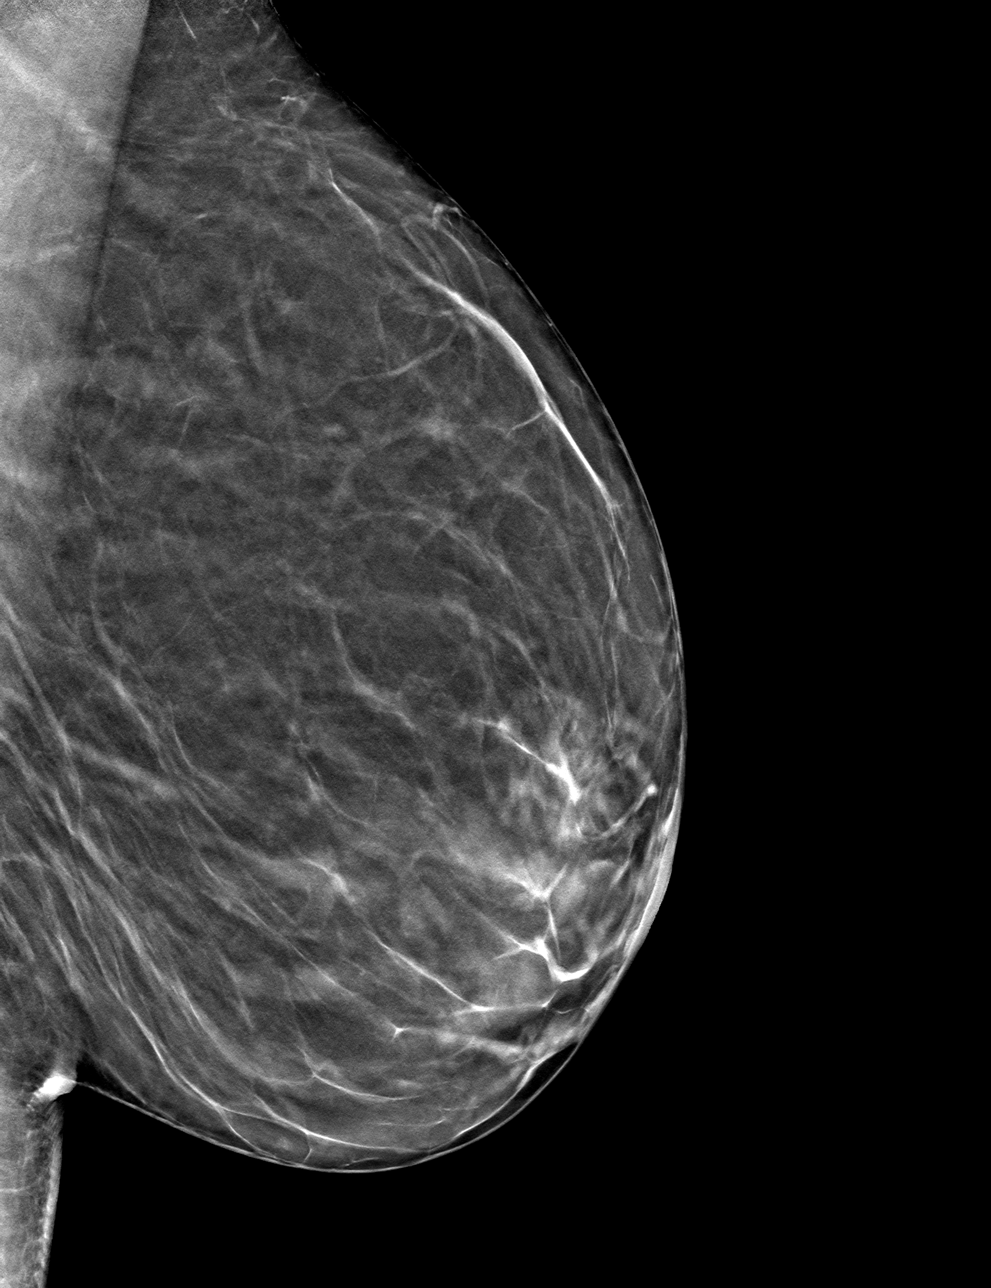

[4 of 12 positions shown; findings below may reference images not displayed]

ACR Breast Density Category b: There are scattered areas of
fibroglandular density.
FINDINGS: The previously described finding does not persist with additional
views, consistent with superimposed fibroglandular tissue. No
suspicious mass, microcalcification, or other finding is identified.
One of the coarse calcifications described on prior CT chest in the
LEFT axillary lymph nodes is seen in the LEFT axilla on mammogram
since at least 8060.

Targeted ultrasound was performed of the LEFT axilla. There are
benign appearing LEFT axillary lymph nodes with thin smooth cortices
and normal echogenic hila. Some of these lymph nodes contain
internal echogenic foci consistent with calcifications. No
suspicious cystic or solid mass is seen.
IMPRESSION: 1. No mammographic evidence of malignancy.
2. Coarsely calcified LEFT axillary lymph nodes are consistent with
the sequela prior granulomatous infection. These are benign in
etiology.

RECOMMENDATION:
Screening mammogram in one year.(Code:[id number]-KN1)

I have discussed the findings and recommendations with the patient.
If applicable, a reminder letter will be sent to the patient
regarding the next appointment.

BI-RADS CATEGORY  2: Benign.

## 2022-05-23 DIAGNOSIS — D369 Benign neoplasm, unspecified site: Secondary | ICD-10-CM | POA: Insufficient documentation

## 2022-07-02 ENCOUNTER — Ambulatory Visit (INDEPENDENT_AMBULATORY_CARE_PROVIDER_SITE_OTHER): Payer: 59 | Admitting: Certified Nurse Midwife

## 2022-07-02 ENCOUNTER — Encounter: Payer: Self-pay | Admitting: Certified Nurse Midwife

## 2022-07-02 VITALS — BP 115/75 | HR 76 | Ht 61.0 in | Wt 122.0 lb

## 2022-07-02 DIAGNOSIS — Z01419 Encounter for gynecological examination (general) (routine) without abnormal findings: Secondary | ICD-10-CM | POA: Diagnosis not present

## 2022-07-02 DIAGNOSIS — Z1231 Encounter for screening mammogram for malignant neoplasm of breast: Secondary | ICD-10-CM

## 2022-07-02 MED ORDER — FEMRING 0.05 MG/24HR VA RING: 1.0000 | VAGINAL_RING | VAGINAL | 4 refills | Status: DC

## 2022-07-02 NOTE — Patient Instructions (Signed)
Preventive Care 40-52 Years Old, Female Preventive care refers to lifestyle choices and visits with your health care provider that can promote health and wellness. Preventive care visits are also called wellness exams. What can I expect for my preventive care visit? Counseling Your health care provider may ask you questions about your: Medical history, including: Past medical problems. Family medical history. Pregnancy history. Current health, including: Menstrual cycle. Method of birth control. Emotional well-being. Home life and relationship well-being. Sexual activity and sexual health. Lifestyle, including: Alcohol, nicotine or tobacco, and drug use. Access to firearms. Diet, exercise, and sleep habits. Work and work environment. Sunscreen use. Safety issues such as seatbelt and bike helmet use. Physical exam Your health care provider will check your: Height and weight. These may be used to calculate your BMI (body mass index). BMI is a measurement that tells if you are at a healthy weight. Waist circumference. This measures the distance around your waistline. This measurement also tells if you are at a healthy weight and may help predict your risk of certain diseases, such as type 2 diabetes and high blood pressure. Heart rate and blood pressure. Body temperature. Skin for abnormal spots. What immunizations do I need?  Vaccines are usually given at various ages, according to a schedule. Your health care provider will recommend vaccines for you based on your age, medical history, and lifestyle or other factors, such as travel or where you work. What tests do I need? Screening Your health care provider may recommend screening tests for certain conditions. This may include: Lipid and cholesterol levels. Diabetes screening. This is done by checking your blood sugar (glucose) after you have not eaten for a while (fasting). Pelvic exam and Pap test. Hepatitis B test. Hepatitis C  test. HIV (human immunodeficiency virus) test. STI (sexually transmitted infection) testing, if you are at risk. Lung cancer screening. Colorectal cancer screening. Mammogram. Talk with your health care provider about when you should start having regular mammograms. This may depend on whether you have a family history of breast cancer. BRCA-related cancer screening. This may be done if you have a family history of breast, ovarian, tubal, or peritoneal cancers. Bone density scan. This is done to screen for osteoporosis. Talk with your health care provider about your test results, treatment options, and if necessary, the need for more tests. Follow these instructions at home: Eating and drinking  Eat a diet that includes fresh fruits and vegetables, whole grains, lean protein, and low-fat dairy products. Take vitamin and mineral supplements as recommended by your health care provider. Do not drink alcohol if: Your health care provider tells you not to drink. You are pregnant, may be pregnant, or are planning to become pregnant. If you drink alcohol: Limit how much you have to 0-1 drink a day. Know how much alcohol is in your drink. In the [place] one drink equals one 12 oz bottle of beer (355 mL), one 5 oz glass of wine (148 mL), or one 1 oz glass of hard liquor (44 mL). Lifestyle Brush your teeth every morning and night with fluoride toothpaste. Floss one time each day. Exercise for at least 30 minutes 5 or more days each week. Do not use any products that contain nicotine or tobacco. These products include cigarettes, chewing tobacco, and vaping devices, such as e-cigarettes. If you need help quitting, ask your health care provider. Do not use drugs. If you are sexually active, practice safe sex. Use a condom or other form of protection to   prevent STIs. If you do not wish to become pregnant, use a form of birth control. If you plan to become pregnant, see your health care provider for a  prepregnancy visit. Take aspirin only as told by your health care provider. Make sure that you understand how much to take and what form to take. Work with your health care provider to find out whether it is safe and beneficial for you to take aspirin daily. Find healthy ways to manage stress, such as: Meditation, yoga, or listening to music. Journaling. Talking to a trusted person. Spending time with friends and family. Minimize exposure to UV radiation to reduce your risk of skin cancer. Safety Always wear your seat belt while driving or riding in a vehicle. Do not drive: If you have been drinking alcohol. Do not ride with someone who has been drinking. When you are tired or distracted. While texting. If you have been using any mind-altering substances or drugs. Wear a helmet and other protective equipment during sports activities. If you have firearms in your house, make sure you follow all gun safety procedures. Seek help if you have been physically or sexually abused. What's next? Visit your health care provider once a year for an annual wellness visit. Ask your health care provider how often you should have your eyes and teeth checked. Stay up to date on all vaccines. This information is not intended to replace advice given to you by your health care provider. Make sure you discuss any questions you have with your health care provider. Document Revised: 01/02/2021 Document Reviewed: 01/02/2021 Elsevier Patient Education  [place].

## 2022-07-02 NOTE — Progress Notes (Signed)
GYNECOLOGY ANNUAL PREVENTATIVE CARE ENCOUNTER NOTE  History:     Amanda Adams is a 51 y.o. G2P0 female here for a routine annual gynecologic exam.  Current complaints: none.   Denies abnormal vaginal bleeding, discharge, pelvic pain, problems with intercourse or other gynecologic concerns.     Social Relationship: married  Living: Spouse and adopted daughter Work: none Exercise: 5x wk Smoke/Alcohol/drug use: denies use   Gynecologic History No LMP recorded. Patient has had a hysterectomy. Contraception: status post hysterectomy Last Pap: 09/2017. Results were: normal with negative HPV Last mammogram: 09/05/21. Results were: normal  Obstetric History OB History  Gravida Para Term Preterm AB Living  2         2  SAB IAB Ectopic Multiple Live Births          2    # Outcome Date GA Lbr Len/2nd Weight Sex Delivery Anes PTL Lv  2 Gravida 1997    F CS-Unspec   LIV  1 Gravida 1995    M CS-Unspec   LIV    Past Medical History:  Diagnosis Date   High cholesterol    Postmenopausal    Vaginal Pap smear, abnormal     Past Surgical History:  Procedure Laterality Date   ABDOMINAL HYSTERECTOMY  2004   BREAST BIOPSY Right    negative years ago   BREAST EXCISIONAL BIOPSY     CESAREAN SECTION     LEEP     TONSILLECTOMY  2002    Current Outpatient Medications on File Prior to Visit  Medication Sig Dispense Refill   acyclovir (ZOVIRAX) 400 MG tablet Take 1 tablet (400 mg total) by mouth 5 (five) times daily. (Patient taking differently: Take 400 mg by mouth as needed.) 35 tablet 2   cyanocobalamin (VITAMIN B12) 1000 MCG/ML injection Inject 1,000 mcg into the muscle every 30 (thirty) days.     Estradiol Acetate (FEMRING) 0.05 MG/24HR RING Place 1 each vaginally every 3 (three) months. 0.9 each 4   Fexofenadine-Pseudoephedrine (ALLEGRA-D PO) Take by mouth daily.     fluticasone (FLONASE) 50 MCG/ACT nasal spray Place into the nose.     meloxicam (MOBIC) 7.5 MG tablet  Take 7.5 mg by mouth every morning.     penciclovir (DENAVIR) 1 % cream Apply 1 application topically every 2 (two) hours. (Patient taking differently: Apply 1 application  topically as needed.) 5 g 2   rosuvastatin (CRESTOR) 10 MG tablet Take 10 mg by mouth daily.     tretinoin (RETIN-A) 0.05 % cream SMARTSIG:1 Topical Daily     simvastatin (ZOCOR) 20 MG tablet Take 20 mg by mouth every other day.     No current facility-administered medications on file prior to visit.    Allergies  Allergen Reactions   Influenza Vaccines     Swelling in the throat  Swelling and pain at the site   Other Hives    Swelling in the throat  Swelling and pain at the site   Atorvastatin Other (See Comments)    Social History:  reports that she has never smoked. She has never used smokeless tobacco. She reports current alcohol use. She reports that she does not use drugs.  Family History  Problem Relation Age of Onset   Heart disease Mother    Vascular Disease Mother    Lung cancer Mother    Heart disease Sister    Pancreatic cancer Brother    Healthy Son  Supraventricular tachycardia Daughter    Healthy Daughter    Healthy Daughter    Breast cancer Neg Hx     The following portions of the patient's history were reviewed and updated as appropriate: allergies, current medications, past family history, past medical history, past social history, past surgical history and problem list.  Review of Systems Pertinent items noted in HPI and remainder of comprehensive ROS otherwise negative.  Physical Exam:  BP 115/75   Pulse 76   Ht '5\' 1"'$  (1.549 m)   Wt 122 lb (55.3 kg)   BMI 23.05 kg/m  CONSTITUTIONAL: Well-developed, well-nourished female in no acute distress.  HENT:  Normocephalic, atraumatic, External right and left ear normal. Oropharynx is clear and moist EYES: Conjunctivae and EOM are normal. Pupils are equal, round, and reactive to light. No scleral icterus.  NECK: Normal range of motion,  supple, no masses.  Normal thyroid.  SKIN: Skin is warm and dry. No rash noted. Not diaphoretic. No erythema. No pallor. MUSCULOSKELETAL: Normal range of motion. No tenderness.  No cyanosis, clubbing, or edema.  2+ distal pulses. NEUROLOGIC: Alert and oriented to person, place, and time. Normal reflexes, muscle tone coordination.  PSYCHIATRIC: Normal mood and affect. Normal behavior. Normal judgment and thought content. CARDIOVASCULAR: Normal heart rate noted, regular rhythm RESPIRATORY: Clear to auscultation bilaterally. Effort and breath sounds normal, no problems with respiration noted. BREASTS: Symmetric in size. No masses, tenderness, skin changes, nipple drainage, or lymphadenopathy bilaterally.  ABDOMEN: Soft, no distention noted.  No tenderness, rebound or guarding.  PELVIC: Normal appearing external genitalia and urethral meatus; normal appearing vaginal mucosa.  Cervix absent, cervical cuff.  No abnormal discharge noted. Uterus absent., no other palpable masses, or adnexal tenderness.  .   Assessment and Plan:    1. Women's annual routine gynecological examination   Pap: not due  Mammogram : ordered  Labs: none Refills: fem ring  Referral: none  Colonoscopy : 03/2022. Polyps noted repeat in 5 yrs 2028 Routine preventative health maintenance measures emphasized. Please refer to After Visit Summary for other counseling recommendations.      Philip Aspen, [facility]

## 2022-07-31 DIAGNOSIS — L821 Other seborrheic keratosis: Secondary | ICD-10-CM | POA: Diagnosis not present

## 2022-07-31 DIAGNOSIS — L298 Other pruritus: Secondary | ICD-10-CM | POA: Diagnosis not present

## 2022-09-11 ENCOUNTER — Ambulatory Visit
Admission: RE | Admit: 2022-09-11 | Discharge: 2022-09-11 | Disposition: A | Discharge status: Home / Self Care | Payer: BC Managed Care – PPO | Source: Ambulatory Visit | Attending: Certified Nurse Midwife | Admitting: Certified Nurse Midwife

## 2022-09-11 DIAGNOSIS — Z1231 Encounter for screening mammogram for malignant neoplasm of breast: Secondary | ICD-10-CM | POA: Diagnosis not present

## 2022-09-11 DIAGNOSIS — Z01419 Encounter for gynecological examination (general) (routine) without abnormal findings: Secondary | ICD-10-CM | POA: Diagnosis not present

## 2022-09-16 ENCOUNTER — Other Ambulatory Visit: Payer: Self-pay | Admitting: Certified Nurse Midwife

## 2022-09-16 ENCOUNTER — Encounter: Payer: Self-pay | Admitting: Certified Nurse Midwife

## 2022-09-16 DIAGNOSIS — R928 Other abnormal and inconclusive findings on diagnostic imaging of breast: Secondary | ICD-10-CM

## 2022-09-16 DIAGNOSIS — N6489 Other specified disorders of breast: Secondary | ICD-10-CM

## 2022-09-17 ENCOUNTER — Other Ambulatory Visit: Payer: Self-pay | Admitting: Certified Nurse Midwife

## 2022-09-26 ENCOUNTER — Ambulatory Visit
Admission: RE | Admit: 2022-09-26 | Discharge: 2022-09-26 | Disposition: A | Discharge status: Home / Self Care | Payer: BC Managed Care – PPO | Source: Ambulatory Visit | Attending: Certified Nurse Midwife | Admitting: Certified Nurse Midwife

## 2022-09-26 ENCOUNTER — Encounter: Payer: Self-pay | Admitting: Certified Nurse Midwife

## 2022-09-26 ENCOUNTER — Other Ambulatory Visit: Payer: Self-pay | Admitting: Certified Nurse Midwife

## 2022-09-26 DIAGNOSIS — R928 Other abnormal and inconclusive findings on diagnostic imaging of breast: Secondary | ICD-10-CM

## 2022-09-26 DIAGNOSIS — N6489 Other specified disorders of breast: Secondary | ICD-10-CM | POA: Insufficient documentation

## 2022-09-26 DIAGNOSIS — Z1231 Encounter for screening mammogram for malignant neoplasm of breast: Secondary | ICD-10-CM | POA: Diagnosis not present

## 2022-10-02 DIAGNOSIS — Z801 Family history of malignant neoplasm of trachea, bronchus and lung: Secondary | ICD-10-CM | POA: Diagnosis not present

## 2022-10-02 DIAGNOSIS — R928 Other abnormal and inconclusive findings on diagnostic imaging of breast: Secondary | ICD-10-CM | POA: Diagnosis not present

## 2022-10-02 DIAGNOSIS — Z8541 Personal history of malignant neoplasm of cervix uteri: Secondary | ICD-10-CM | POA: Diagnosis not present

## 2022-10-02 DIAGNOSIS — N644 Mastodynia: Secondary | ICD-10-CM | POA: Diagnosis not present

## 2022-10-02 DIAGNOSIS — Z800 Family history of malignant neoplasm of digestive organs: Secondary | ICD-10-CM | POA: Diagnosis not present

## 2022-10-02 DIAGNOSIS — Z79899 Other long term (current) drug therapy: Secondary | ICD-10-CM | POA: Diagnosis not present

## 2022-10-02 DIAGNOSIS — E785 Hyperlipidemia, unspecified: Secondary | ICD-10-CM | POA: Diagnosis not present

## 2022-10-09 DIAGNOSIS — N6082 Other benign mammary dysplasias of left breast: Secondary | ICD-10-CM | POA: Diagnosis not present

## 2022-10-09 DIAGNOSIS — N6489 Other specified disorders of breast: Secondary | ICD-10-CM | POA: Diagnosis not present

## 2022-10-09 DIAGNOSIS — R928 Other abnormal and inconclusive findings on diagnostic imaging of breast: Secondary | ICD-10-CM | POA: Diagnosis not present

## 2022-10-09 DIAGNOSIS — N6012 Diffuse cystic mastopathy of left breast: Secondary | ICD-10-CM | POA: Diagnosis not present

## 2022-10-09 DIAGNOSIS — N6092 Unspecified benign mammary dysplasia of left breast: Secondary | ICD-10-CM | POA: Diagnosis not present

## 2022-12-12 ENCOUNTER — Telehealth: Payer: Self-pay

## 2022-12-19 NOTE — Telephone Encounter (Signed)
Mychart message sent to patient regarding insurance and no alternatives.

## 2022-12-19 NOTE — Telephone Encounter (Signed)
Spoke to patient. She did have a change in insurance but found a coupon and is all set.

## 2022-12-26 ENCOUNTER — Ambulatory Visit (INDEPENDENT_AMBULATORY_CARE_PROVIDER_SITE_OTHER): Payer: BC Managed Care – PPO

## 2022-12-26 ENCOUNTER — Ambulatory Visit (INDEPENDENT_AMBULATORY_CARE_PROVIDER_SITE_OTHER): Payer: BC Managed Care – PPO | Admitting: Podiatry

## 2022-12-26 ENCOUNTER — Encounter: Payer: Self-pay | Admitting: Podiatry

## 2022-12-26 DIAGNOSIS — M21612 Bunion of left foot: Secondary | ICD-10-CM | POA: Diagnosis not present

## 2022-12-26 DIAGNOSIS — M21611 Bunion of right foot: Secondary | ICD-10-CM

## 2022-12-26 NOTE — Progress Notes (Signed)
Chief Complaint  Patient presents with   Bunions    np - bil bunions / left is worse    Subjective: 52 y.o. female presents today as a new patient for evaluation of a symptomatic bunion specifically to the left foot.  Patient has a history of bunions for several years.  They have progressively gotten worse with time.  She says that the left foot bunion is most symptomatic.  She tries wearing wide Brooks running shoes but she continues to have pain and tenderness associated to the bunion site.  Conservative treatments have been unsuccessful in alleviating any of her symptoms.  Presenting for further treatment evaluation  Past Medical History:  Diagnosis Date   High cholesterol    Postmenopausal    Vaginal Pap smear, abnormal     Past Surgical History:  Procedure Laterality Date   ABDOMINAL HYSTERECTOMY  2004   BREAST BIOPSY Right    negative years ago   BREAST EXCISIONAL BIOPSY Right    CESAREAN SECTION     LEEP     TONSILLECTOMY  2002    Allergies  Allergen Reactions   Influenza Vaccines     Swelling in the throat  Swelling and pain at the site   Other Hives    Swelling in the throat  Swelling and pain at the site   Atorvastatin Other (See Comments)     Objective: Physical Exam General: The patient is alert and oriented x3 in no acute distress.  Dermatology: Skin is cool, dry and supple bilateral lower extremities. Negative for open lesions or macerations.  Vascular: Palpable pedal pulses bilaterally. No edema or erythema noted. Capillary refill within normal limits.  Neurological: Epicritic and protective threshold grossly intact bilaterally.   Musculoskeletal Exam: Clinical evidence of bunion deformity noted to the respective foot. There is moderate pain on palpation range of motion of the first MPJ. Lateral deviation of the hallux noted consistent with hallux abductovalgus.  Radiographic Exam LT foot 12/26/2022: Normal osseous mineralization.  No acute fractures  identified.  Increased intermetatarsal angle greater than 15 with a hallux abductus angle greater than 30 noted on AP view.   Assessment: 1.  Hallux valgus left   Plan of Care:  -Patient was evaluated. X-Rays reviewed. -Today we discussed the pathology and etiology of bunion deformities.  Discussed different treatment options both conservative and surgical.  Unfortunately patient has failed conservative treatment and alleviating pain associated to her bunions.  She says that she is at the point that she would like to discuss and consider surgery.  Surgery was discussed in detail to the patient specifically regarding the Lapidus type bunionectomy.  Details of the procedure as well as risk benefits advantages and disadvantages were explained.  No guarantees were expressed or implied.  All patient questions were answered.  The patient consented and would like to proceed with surgery at this time this summer -Authorization for surgery was initiated today.  Surgery will consist of Lapidus type bunionectomy left. -Return to clinic 1 week postop  *Does not work, but takes care of her grandchildren and also has an 68 yr old adopted daughter at home   Felecia Shelling, DPM [facility]  Dr. Felecia Shelling, DPM    [address]  [place], [place] [address]                Office [phone]  Fax [phone]

## 2022-12-29 ENCOUNTER — Telehealth: Payer: Self-pay

## 2022-12-29 NOTE — Telephone Encounter (Signed)
Received surgery paperwork from the [facility]. Left a message for Mikinzie to call and scheduled surgery with Dr. Logan Bores.

## 2023-01-28 ENCOUNTER — Telehealth: Payer: BC Managed Care – PPO | Admitting: Nurse Practitioner

## 2023-01-28 DIAGNOSIS — H1030 Unspecified acute conjunctivitis, unspecified eye: Secondary | ICD-10-CM | POA: Diagnosis not present

## 2023-01-28 MED ORDER — OFLOXACIN 0.3 % OP SOLN: 1.0000 [drp] | Freq: Four times a day (QID) | OPHTHALMIC | 0 refills | Status: AC

## 2023-01-28 NOTE — Progress Notes (Signed)
E-Visit for Pink Eye   We are sorry that you are not feeling well.  Here is how we plan to help!  Based on what you have shared with me it looks like you have conjunctivitis.  Conjunctivitis is a common inflammatory or infectious condition of the eye that is often referred to as "pink eye".  In most cases it is contagious (viral or bacterial). However, not all conjunctivitis requires antibiotics (ex. Allergic).  We have made appropriate suggestions for you based upon your presentation.  I have prescribed Oflaxacin 1-2 drops 4 times a day times 5 days   Pink eye can be highly contagious.  It is typically spread through direct contact with secretions, or contaminated objects or surfaces that one may have touched.  Strict handwashing is suggested with soap and water is urged.  If not available, use alcohol based had sanitizer.  Avoid unnecessary touching of the eye.  If you wear contact lenses, you will need to refrain from wearing them until you see no white discharge from the eye for at least 24 hours after being on medication.  You should see symptom improvement in 1-2 days after starting the medication regimen.  Call us if symptoms are not improved in 1-2 days.  Home Care: Wash your hands often! Do not wear your contacts until you complete your treatment plan. Avoid sharing towels, bed linen, personal items with a person who has pink eye. See attention for anyone in your home with similar symptoms.  Get Help Right Away If: Your symptoms do not improve. You develop blurred or loss of vision. Your symptoms worsen (increased discharge, pain or redness)   Thank you for choosing an e-visit.  Your e-visit answers were reviewed by a board certified advanced clinical practitioner to complete your personal care plan. Depending upon the condition, your plan could have included both over the counter or prescription medications.  Please review your pharmacy choice. Make sure the pharmacy is open so  you can pick up prescription now. If there is a problem, you may contact your provider through MyChart messaging and have the prescription routed to another pharmacy.  Your safety is important to us. If you have drug allergies check your prescription carefully.   For the next 24 hours you can use MyChart to ask questions about today's visit, request a non-urgent call back, or ask for a work or school excuse. You will get an email in the next two days asking about your experience. I hope that your e-visit has been valuable and will speed your recovery.  Meds ordered this encounter  Medications   ofloxacin (OCUFLOX) 0.3 % ophthalmic solution    Sig: Place 1 drop into both eyes 4 (four) times daily for 5 days.    Dispense:  5 mL    Refill:  0    I spent approximately 5 minutes reviewing the patient's history, current symptoms and coordinating their care today.   

## 2023-03-13 ENCOUNTER — Telehealth: Payer: Self-pay

## 2023-03-13 NOTE — Telephone Encounter (Addendum)
Spoke to patient to clarify as previous message from 11/2022 stated patient is using coupon for [facility]. Patient states she just left the pharmacy from trying to pick up [facility]. Pharmacy states they are unable to process the coupon unless the PA is processed and the insurance denies. Advised patient we will process the PA. It may take several days before we receive a response.

## 2023-03-17 NOTE — Telephone Encounter (Signed)
Form has been filled out and faxed to [facility] pharmacy. A copy of form will be left up front to scan in chart. KW

## 2023-04-09 ENCOUNTER — Encounter: Payer: Self-pay | Admitting: Certified Nurse Midwife

## 2023-04-13 DIAGNOSIS — R928 Other abnormal and inconclusive findings on diagnostic imaging of breast: Secondary | ICD-10-CM | POA: Diagnosis not present

## 2023-04-21 DIAGNOSIS — N6489 Other specified disorders of breast: Secondary | ICD-10-CM | POA: Diagnosis not present

## 2023-04-21 DIAGNOSIS — R928 Other abnormal and inconclusive findings on diagnostic imaging of breast: Secondary | ICD-10-CM | POA: Diagnosis not present

## 2023-04-21 DIAGNOSIS — N6092 Unspecified benign mammary dysplasia of left breast: Secondary | ICD-10-CM | POA: Diagnosis not present

## 2023-04-21 HISTORY — PX: BREAST BIOPSY: SHX20

## 2023-06-01 DIAGNOSIS — H18599 Other hereditary corneal dystrophies, unspecified eye: Secondary | ICD-10-CM | POA: Diagnosis not present

## 2023-06-01 DIAGNOSIS — H16141 Punctate keratitis, right eye: Secondary | ICD-10-CM | POA: Diagnosis not present

## 2023-06-08 ENCOUNTER — Telehealth: Payer: Self-pay | Admitting: Podiatry

## 2023-06-08 NOTE — Telephone Encounter (Signed)
DOS-07/09/2023  LAPIDUS PROCEDURE INCLUDING BUNIONECTOMY [id number]  [employer] EFFECTIVE DATE- 07/21/2022  DEDUCTIBLE- $5000.00 WITH REMAINING $0.00 OOP- $8050.00 WITH REMAINING $2774.29 COINSURANCE- 30%  PER THE [employer] WEBSITE PORTAL, PRIOR AUTH HAS BEEN APPROVED FOR CPT CODE 41324. GOOD FROM 07/09/2023 - 09/06/2023  AUTH REFERENCE NUMBER: Order ID: [id number]

## 2023-07-07 ENCOUNTER — Other Ambulatory Visit: Payer: Self-pay | Admitting: Certified Nurse Midwife

## 2023-07-07 DIAGNOSIS — N951 Menopausal and female climacteric states: Secondary | ICD-10-CM

## 2023-07-09 NOTE — Telephone Encounter (Signed)
Chart reviewed. Patient has annual 07/17/23. Refill approved- inadvertently printed rx. Contacted pharmacy to give verbal on refill approval as well as [employer] [facility] coupon code: BIN [id number] PCN [facility] GRP [id number] UID [id number]

## 2023-07-09 NOTE — Telephone Encounter (Signed)
TRIAGE VOICEMAIL: Patient calling to follow up on refill request pharmacy sent on Estradiol Acetate (FEMRING) 0.05 MG/24HR RING . She is also inquiring about a savings card for this rx.

## 2023-07-09 NOTE — Telephone Encounter (Signed)
Patient aware.

## 2023-07-10 ENCOUNTER — Encounter: Payer: BC Managed Care – PPO | Admitting: Podiatry

## 2023-07-17 ENCOUNTER — Ambulatory Visit: Payer: BC Managed Care – PPO | Admitting: Certified Nurse Midwife

## 2023-07-17 ENCOUNTER — Encounter: Payer: BC Managed Care – PPO | Admitting: Podiatry

## 2023-07-23 ENCOUNTER — Encounter: Payer: Self-pay | Admitting: Certified Nurse Midwife

## 2023-07-24 ENCOUNTER — Encounter: Payer: BC Managed Care – PPO | Admitting: Podiatry

## 2023-07-30 ENCOUNTER — Other Ambulatory Visit: Payer: Self-pay | Admitting: Certified Nurse Midwife

## 2023-07-30 DIAGNOSIS — N951 Menopausal and female climacteric states: Secondary | ICD-10-CM

## 2023-07-31 ENCOUNTER — Encounter: Payer: BC Managed Care – PPO | Admitting: Podiatry

## 2023-08-14 ENCOUNTER — Encounter: Payer: BC Managed Care – PPO | Admitting: Podiatry

## 2023-08-23 ENCOUNTER — Telehealth: Payer: BC Managed Care – PPO | Admitting: Nurse Practitioner

## 2023-08-23 DIAGNOSIS — R399 Unspecified symptoms and signs involving the genitourinary system: Secondary | ICD-10-CM | POA: Diagnosis not present

## 2023-08-23 MED ORDER — NITROFURANTOIN MONOHYD MACRO 100 MG PO CAPS
100.0000 mg | ORAL_CAPSULE | Freq: Two times a day (BID) | ORAL | 0 refills | Status: AC
Start: 2023-08-23 — End: 2023-08-28

## 2023-08-23 NOTE — Progress Notes (Signed)

## 2023-08-23 NOTE — Progress Notes (Signed)
 I have spent 5 minutes in review of e-visit questionnaire, review and updating patient chart, medical decision making and response to patient.   Claiborne Rigg, NP

## 2023-08-29 ENCOUNTER — Telehealth: Payer: BC Managed Care – PPO | Admitting: Family Medicine

## 2023-08-29 DIAGNOSIS — J101 Influenza due to other identified influenza virus with other respiratory manifestations: Secondary | ICD-10-CM

## 2023-08-29 MED ORDER — OSELTAMIVIR PHOSPHATE 75 MG PO CAPS: 75.0000 mg | ORAL_CAPSULE | Freq: Two times a day (BID) | ORAL | 0 refills | Status: AC

## 2023-08-29 NOTE — Progress Notes (Signed)

## 2023-09-10 DIAGNOSIS — R928 Other abnormal and inconclusive findings on diagnostic imaging of breast: Secondary | ICD-10-CM | POA: Diagnosis not present

## 2023-09-10 DIAGNOSIS — N644 Mastodynia: Secondary | ICD-10-CM | POA: Diagnosis not present

## 2023-09-22 DIAGNOSIS — R928 Other abnormal and inconclusive findings on diagnostic imaging of breast: Secondary | ICD-10-CM | POA: Diagnosis not present

## 2023-09-22 DIAGNOSIS — N644 Mastodynia: Secondary | ICD-10-CM | POA: Diagnosis not present

## 2023-10-14 ENCOUNTER — Other Ambulatory Visit: Payer: Self-pay | Admitting: Certified Nurse Midwife

## 2023-10-14 DIAGNOSIS — N951 Menopausal and female climacteric states: Secondary | ICD-10-CM

## 2023-10-22 DIAGNOSIS — D492 Neoplasm of unspecified behavior of bone, soft tissue, and skin: Secondary | ICD-10-CM | POA: Diagnosis not present

## 2023-12-01 DIAGNOSIS — Z0000 Encounter for general adult medical examination without abnormal findings: Secondary | ICD-10-CM | POA: Diagnosis not present

## 2023-12-01 DIAGNOSIS — Z79899 Other long term (current) drug therapy: Secondary | ICD-10-CM | POA: Diagnosis not present

## 2023-12-01 DIAGNOSIS — Z131 Encounter for screening for diabetes mellitus: Secondary | ICD-10-CM | POA: Diagnosis not present

## 2023-12-01 DIAGNOSIS — Z13220 Encounter for screening for lipoid disorders: Secondary | ICD-10-CM | POA: Diagnosis not present

## 2023-12-10 DIAGNOSIS — Z0000 Encounter for general adult medical examination without abnormal findings: Secondary | ICD-10-CM | POA: Diagnosis not present

## 2023-12-10 DIAGNOSIS — L659 Nonscarring hair loss, unspecified: Secondary | ICD-10-CM | POA: Diagnosis not present

## 2023-12-10 DIAGNOSIS — Z1331 Encounter for screening for depression: Secondary | ICD-10-CM | POA: Diagnosis not present

## 2023-12-10 DIAGNOSIS — E875 Hyperkalemia: Secondary | ICD-10-CM | POA: Diagnosis not present

## 2023-12-17 DIAGNOSIS — H663X3 Other chronic suppurative otitis media, bilateral: Secondary | ICD-10-CM | POA: Diagnosis not present

## 2023-12-18 DIAGNOSIS — H903 Sensorineural hearing loss, bilateral: Secondary | ICD-10-CM | POA: Diagnosis not present

## 2023-12-29 ENCOUNTER — Other Ambulatory Visit: Payer: Self-pay | Admitting: Otolaryngology

## 2024-01-06 ENCOUNTER — Other Ambulatory Visit: Payer: Self-pay

## 2024-01-06 MED ORDER — CIPROFLOXACIN-DEXAMETHASONE 0.3-0.1 % OT SUSP
4.0000 [drp] | Freq: Two times a day (BID) | OTIC | 0 refills | Status: AC
  Filled 2024-01-06: qty 7.5, 19d supply, fill #0

## 2024-01-08 NOTE — Discharge Instructions (Signed)
 [facility] DISCHARGE INSTRUCTIONS FOR MYRINGOTOMY AND TUBE INSERTION  [facility] AUSTIN ROSE, M.D.  Diet:   After surgery, the patient should take only liquids and foods as tolerated.  The patient may then have a regular diet after the effects of anesthesia have worn off, usually about four to six hours after surgery.  Activities:   The patient should rest until the effects of anesthesia have worn off.  After this, there are no restrictions on the normal daily activities.  Medications:   You will be given a prescription for antibiotic drops to be used in the ears postoperatively.  It is recommended to use 5 drops 2 times a day for 5 days, then the drops should be saved for possible future use.  The tubes should not cause any discomfort to the patient, but if there is any question, Tylenol should be given according to the instructions for the age of the patient.  Other medications should be continued normally.  Precautions:   Should there be recurrent drainage after the tubes are placed, the drops should be used for approximately 3-4 days.  If it does not clear, you should call the ENT office.  Earplugs:   Earplugs are only needed for those who are going to be submerged under water.  When taking a bath or shower and using a cup or showerhead to rinse hair, it is not necessary to wear earplugs.  These come in a variety of fashions, all of which can be obtained at our office.  However, if one is not able to come by the office, then silicone plugs can be found at most pharmacies.  It is not advised to stick anything in the ear that is not approved as an earplug.  Silly putty is not to be used as an earplug.  Swimming is allowed in patients after ear tubes are inserted, however, they must wear earplugs if they are going to be submerged under water.  For those children who are going to be swimming a lot, it is recommended to use a fitted ear mold, which can be made by  our audiologist.  If discharge is noticed from the ears, this most likely represents an ear infection.  We would recommend getting your eardrops and using them as indicated above.  If it does not clear, then you should call the ENT office.  For follow up, the patient should return to the ENT office three weeks postoperatively and then every six months as required by the doctor.

## 2024-01-11 ENCOUNTER — Other Ambulatory Visit: Payer: Self-pay

## 2024-01-11 ENCOUNTER — Encounter: Payer: Self-pay | Admitting: Otolaryngology

## 2024-01-13 ENCOUNTER — Other Ambulatory Visit: Payer: Self-pay | Admitting: Certified Nurse Midwife

## 2024-01-13 DIAGNOSIS — N951 Menopausal and female climacteric states: Secondary | ICD-10-CM

## 2024-01-13 NOTE — H&P (Signed)
 Chief Complaints: 1. eustachian tube dysfunction HPI: This is a 53 year old female who: is being seen for a chief complaint of eustachian tube dysfunction located in the left ear. She has eustachian tube dysfunction that is described as the sensation of ear fullness and the inability to clear the ear(s). She has eustachian tube dysfunction that is worsening and mild in severity for about 3 years. She has associated dizziness and drainage (left ear). She is unable to pop the left ear, and gets drainage from her left eye when she auto-inflatesThe history was felt to be reliable. ---- [name] reports sx of chronic Eustachian tube dysfunction x many years despite maximal medical management with topical nasal steroid sprays. She had PE tubes as a child, but not recently. Dr. Juengel, per her report, had discussed and recommended replacement of PE tubes at some point again, and she thinks she may finally be ready for this. the sx are intermittent, but frequent, and her episodes of pain and need to pop her ears, aural fullness and decreased hearing are impacting her life significantly. 1. Vitals: Date Taken By B.P. Pulse Resp. O2 Sat. Temp. Ht. Wt. BMI BSA 12/18/23 10:32 WILLIFORD, JOSHUA 126/74 SIT 98.2 F 61.0 in 118.0 lbs 22.3 1.5 FiO2 * Patient Reported Exam: An Otolaryngologic exam was performed Otolaryngologic exam External Ears: external ear examination of normal size and morphology without traumatic or congenital deformity AD, external ear examination of normal size and morphology without traumatic or congenital deformity AS. External ear canal AD: Normal EAC exam External ear canal AS: Normal EAC exam Tympanic membrane AD: AD tympanic membrane intact, no fluid, normal mobility on pneumotoscopy Tympanic membrane AS: AS tympanic membrane intact, no fluid, normal mobility on pneumotoscopy External Nose: Nasal dorsum midline Right Nasal Cavity: right intranasal  examination normal without turbinate hypertrophy, masses, septal deformity or synechiae Left nasal cavity: left intranasal examination normal without turbinate hypertrophy, masses, septal deformity or synechiae Lips, Teeth, Gums: normal lip morphology and anatomy, class I occlusion, no dental abnormalities  Oral cavity/Oropharynx: normal hard and soft palate, tongue, pharyngeal walls, buccal mucosa, floor of mouth, and tonsils Visit Note - Dec 17, 2023 MADALEINE, SIMMON MRN: [id number] DOB: 1970-10-03 Sex: Female PMS ID: [id number] Amanda Adams (Primary Provider) (Bill Under) Page 2 [phone] Work [phone] Fax [facility] [place] [address] [place], [place] [address] Weight Loss, No Rash, No Headache, No Anxiety, And No Shortness Of Breath. Medical History Obtained and Reviewed Dec 18, 2023. Hyperlipidemia Surgical History Obtained and Reviewed Dec 18, 2023. Cesarean section: X2 Hysterectomy Head Inspection: Normal head inspection with normal head shape, without masses or concerning lesions.  Head Palpation: Normal head inspection without masses, palpable deformities, or concerning lesions. Salivary: No palpable salivary gland masses - no erythema or tenderness. Facial nerve intact and symmetric bilaterally. Neck: normal neck examination without skin masses, tenderness or crepitus  Thyroid : normal thyroid  examination without masses or nodules Respiratory Effort: normal respiratory effort without labored breathing or accessory muscle use  Neck Lymph Node: normal lymphatic exam without lymphadenopathy in cranial or cervical regions Neuro - Cranial Nerves: Cranial nerves II-XII intact.  Chest - clear to auscultation bilaterally C/V - regular rate and rhythm without murmur    Appearance: Small for age of 66 mos, but appears alert - can support head. No stridor or airway distress. Communication: normal vocal quality and ability to  communicate Orientation: Alert and oriented to person, place, time. Mood:mood and  affect well-adjusted, pleasant and cooperative, appropriate for clinical and encounter circumstances Impression/Plan: Otitis media, chronic, Bilateral Other chronic suppurative otitis media, bilateral (Y33.6K6) Plan: Treatment Regimen. Begin the following treatments: ** Chronic Eustachian tube dysfunction despite maximal medical therapy - recommend to OR for bilateral PE tubes (likely butterfly vs. T-tubes) in the near future. Anticipate any Amanda Adams OR location and DC home same day. RTC - Dr .Rumalda at [facility] office 4- 6 weeks postop with audiogram.. Care Regimen: Discussed the risks, benefits, and options of this procedure with the patient / family today - they understand and agree to proceed. The patient / family does have all of our contact information if needed.  Amanda RAMAN. Rumalda, MD, MBA, [facility] Otolaryngology-Head & Neck Surgery [place] [facility] [phone]

## 2024-01-14 ENCOUNTER — Ambulatory Visit
Admission: RE | Admit: 2024-01-14 | Discharge: 2024-01-14 | Disposition: A | Discharge status: Home / Self Care | Attending: Otolaryngology | Admitting: Otolaryngology

## 2024-01-14 ENCOUNTER — Encounter: Payer: Self-pay | Admitting: Otolaryngology

## 2024-01-14 ENCOUNTER — Encounter
Admission: RE | Disposition: A | Discharge status: Home / Self Care | Payer: Self-pay | Source: Home / Self Care | Attending: Otolaryngology

## 2024-01-14 ENCOUNTER — Ambulatory Visit: Payer: Self-pay | Admitting: Anesthesiology

## 2024-01-14 ENCOUNTER — Other Ambulatory Visit: Payer: Self-pay

## 2024-01-14 DIAGNOSIS — H663X3 Other chronic suppurative otitis media, bilateral: Secondary | ICD-10-CM | POA: Insufficient documentation

## 2024-01-14 DIAGNOSIS — H6523 Chronic serous otitis media, bilateral: Secondary | ICD-10-CM | POA: Diagnosis not present

## 2024-01-14 HISTORY — DX: Other complications of anesthesia, initial encounter: T88.59XA

## 2024-01-14 HISTORY — PX: MYRINGOTOMY WITH TUBE PLACEMENT: SHX5663

## 2024-01-14 HISTORY — DX: Other specified postprocedural states: Z98.890

## 2024-01-14 SURGERY — MYRINGOTOMY WITH TUBE PLACEMENT
Anesthesia: General | Laterality: Bilateral

## 2024-01-14 MED ORDER — LIDOCAINE HCL (CARDIAC) PF 100 MG/5ML IV SOSY
PREFILLED_SYRINGE | INTRAVENOUS | Status: DC | PRN
  Administered 2024-01-14: 50 mg via INTRAVENOUS

## 2024-01-14 MED ORDER — DEXAMETHASONE SODIUM PHOSPHATE 4 MG/ML IJ SOLN
INTRAMUSCULAR | Status: DC | PRN
  Administered 2024-01-14: 4 mg via INTRAVENOUS

## 2024-01-14 MED ORDER — CIPROFLOXACIN-DEXAMETHASONE 0.3-0.1 % OT SUSP: 4.0000 [drp] | Freq: Two times a day (BID) | OTIC | Status: DC

## 2024-01-14 MED ORDER — MIDAZOLAM HCL 2 MG/2ML IJ SOLN
INTRAMUSCULAR | Status: DC | PRN
  Administered 2024-01-14: 2 mg via INTRAVENOUS

## 2024-01-14 MED ORDER — ACETAMINOPHEN 500 MG PO TABS
1000.0000 mg | ORAL_TABLET | Freq: Four times a day (QID) | ORAL | Status: DC | PRN
  Administered 2024-01-14: 1000 mg via ORAL

## 2024-01-14 MED ORDER — PROPOFOL 1000 MG/100ML IV EMUL
INTRAVENOUS | Status: AC
Start: 2024-01-14 — End: 2024-01-14
  Filled 2024-01-14: qty 100

## 2024-01-14 MED ORDER — ONDANSETRON HCL 4 MG/2ML IJ SOLN
INTRAMUSCULAR | Status: DC | PRN
  Administered 2024-01-14: 4 mg via INTRAVENOUS

## 2024-01-14 MED ORDER — ACETAMINOPHEN 500 MG PO TABS
ORAL_TABLET | ORAL | Status: AC
Start: 2024-01-14 — End: 2024-01-14
  Filled 2024-01-14: qty 2

## 2024-01-14 MED ORDER — DROPERIDOL 2.5 MG/ML IJ SOLN: 0.6250 mg | Freq: Once | INTRAMUSCULAR | Status: DC | PRN

## 2024-01-14 MED ORDER — PROPOFOL 10 MG/ML IV BOLUS
INTRAVENOUS | Status: DC | PRN
  Administered 2024-01-14: 100 mg via INTRAVENOUS

## 2024-01-14 MED ORDER — MIDAZOLAM HCL 2 MG/2ML IJ SOLN
INTRAMUSCULAR | Status: AC
  Filled 2024-01-14: qty 2

## 2024-01-14 MED ORDER — EPHEDRINE SULFATE (PRESSORS) 50 MG/ML IJ SOLN
INTRAMUSCULAR | Status: DC | PRN
  Administered 2024-01-14: 10 mg via INTRAVENOUS

## 2024-01-14 MED ORDER — FENTANYL CITRATE (PF) 100 MCG/2ML IJ SOLN
INTRAMUSCULAR | Status: DC | PRN
  Administered 2024-01-14: 50 ug via INTRAVENOUS

## 2024-01-14 MED ORDER — FENTANYL CITRATE (PF) 100 MCG/2ML IJ SOLN
INTRAMUSCULAR | Status: AC
Start: 2024-01-14 — End: 2024-01-14
  Filled 2024-01-14: qty 2

## 2024-01-14 MED ORDER — CIPROFLOXACIN-DEXAMETHASONE 0.3-0.1 % OT SUSP
OTIC | Status: DC | PRN
  Administered 2024-01-14: 4 [drp] via OTIC

## 2024-01-14 SURGICAL SUPPLY — 10 items
BLADE MYR LANCE NRW W/HDL (BLADE) ×1 IMPLANT
CANISTER SUCT 1200ML W/VALVE (MISCELLANEOUS) ×1 IMPLANT
COTTONBALL LRG STERILE PKG (GAUZE/BANDAGES/DRESSINGS) ×1 IMPLANT
GAUZE SPONGE 4X4 12PLY STRL (GAUZE/BANDAGES/DRESSINGS) ×1 IMPLANT
GLOVE PI ULTRA LF STRL 7.5 (GLOVE) ×1 IMPLANT
STRAP BODY AND KNEE 60X3 (MISCELLANEOUS) ×1 IMPLANT
TOWEL OR 17X26 4PK STRL BLUE (TOWEL DISPOSABLE) ×1 IMPLANT
TUBE GRMT FLRPLST BEV 1.14 (OTOLOGIC RELATED) ×1 IMPLANT
TUBE VENT DURAVENT 1.27MM (OTOLOGIC RELATED) IMPLANT
TUBING SUCTION CONN 0.25 STRL (TUBING) ×1 IMPLANT

## 2024-01-14 NOTE — Anesthesia Postprocedure Evaluation (Signed)
 Anesthesia Post Note  Patient: Amanda Adams  Procedure(s) Performed: MYRINGOTOMY WITH TUBE PLACEMENT (Bilateral)  Patient location during evaluation: PACU Anesthesia Type: General Level of consciousness: awake and alert Pain management: pain level controlled Vital Signs Assessment: post-procedure vital signs reviewed and stable Respiratory status: spontaneous breathing, nonlabored ventilation, respiratory function stable and patient connected to nasal cannula oxygen Cardiovascular status: blood pressure returned to baseline and stable Postop Assessment: no apparent nausea or vomiting Anesthetic complications: no   No notable events documented.   Last Vitals:  Vitals:   01/14/24 1110 01/14/24 1115  BP: 99/61 111/62  Pulse: 61 63  Resp: 16 14  Temp: (!) 36.4 C   SpO2: 98% 95%    Last Pain:  Vitals:   01/14/24 1124  TempSrc:   PainSc: 0-No pain                 [name]

## 2024-01-14 NOTE — Transfer of Care (Signed)
 Immediate Anesthesia Transfer of Care Note  Patient: Amanda Adams  Procedure(s) Performed: MYRINGOTOMY WITH TUBE PLACEMENT (Bilateral)  Patient Location: PACU  Anesthesia Type: General  Level of Consciousness: awake, alert  and patient cooperative  Airway and Oxygen Therapy: Patient Spontanous Breathing and Patient connected to supplemental oxygen  Post-op Assessment: Post-op Vital signs reviewed, Patient's Cardiovascular Status Stable, Respiratory Function Stable, Patent Airway and No signs of Nausea or vomiting  Post-op Vital Signs: Reviewed and stable  Complications: No notable events documented.

## 2024-01-14 NOTE — Interval H&P Note (Signed)
 History and Physical Interval Note:  01/14/2024 10:34 AM  Amanda Adams  has presented today for surgery, with the diagnosis of CHRONIC OTITIS MEDIA.  The various methods of treatment have been discussed with the patient and family. After consideration of risks, benefits and other options for treatment, the patient has consented to  Procedure(s): MYRINGOTOMY WITH TUBE PLACEMENT (Bilateral) as a surgical intervention.  The patient's history has been reviewed, patient examined, no change in status, stable for surgery.  I have reviewed the patient's chart and labs.  Questions were answered to the patient's satisfaction.     Adams Skates S  No changes to H&P.   Skates GORMAN. Rumalda, MD, MBA, [facility] Otolaryngology-Head & Neck Surgery [place] [facility] [phone]

## 2024-01-14 NOTE — Anesthesia Preprocedure Evaluation (Signed)
 Anesthesia Evaluation  Patient identified by MRN, date of birth, ID band Patient awake    Reviewed: Allergy & Precautions, NPO status , Patient's Chart, lab work & pertinent test results  History of Anesthesia Complications (+) PONV and history of anesthetic complications  Airway Mallampati: II  TM Distance: >3 FB Neck ROM: full    Dental no notable dental hx.    Pulmonary neg pulmonary ROS   Pulmonary exam normal        Cardiovascular negative cardio ROS Normal cardiovascular exam     Neuro/Psych negative neurological ROS  negative psych ROS   GI/Hepatic negative GI ROS, Neg liver ROS,,,  Endo/Other  negative endocrine ROS    Renal/GU negative Renal ROS  negative genitourinary   Musculoskeletal   Abdominal   Peds  Hematology negative hematology ROS (+)   Anesthesia Other Findings Past Medical History: No date: Complication of anesthesia No date: High cholesterol No date: PONV (postoperative nausea and vomiting) No date: Postmenopausal No date: Vaginal Pap smear, abnormal  Past Surgical History: 2004: ABDOMINAL HYSTERECTOMY No date: BREAST BIOPSY; Right     Comment:  negative years ago 04/2023: BREAST BIOPSY; Left     Comment:  CORE NEEDLE BIOPSY No date: BREAST EXCISIONAL BIOPSY; Right No date: CESAREAN SECTION No date: LEEP 2002: TONSILLECTOMY  BMI    Body Mass Index: 22.30 kg/m      Reproductive/Obstetrics negative OB ROS                             Anesthesia Physical Anesthesia Plan  ASA: 2  Anesthesia Plan: General   Post-op Pain Management: Minimal or no pain anticipated   Induction: Intravenous  PONV Risk Score and Plan: 4 or greater and Ondansetron, Dexamethasone , Midazolam and Treatment may vary due to age or medical condition  Airway Management Planned: Mask  Additional Equipment:   Intra-op Plan:   Post-operative Plan: Extubation in  OR  Informed Consent: I have reviewed the patients History and Physical, chart, labs and discussed the procedure including the risks, benefits and alternatives for the proposed anesthesia with the patient or authorized representative who has indicated his/her understanding and acceptance.     Dental Advisory Given  Plan Discussed with: Anesthesiologist, CRNA and Surgeon  Anesthesia Plan Comments: (Patient consented for risks of anesthesia including but not limited to:  - adverse reactions to medications - damage to eyes, teeth, lips or other oral mucosa - nerve damage due to positioning  - sore throat or hoarseness - Damage to heart, brain, nerves, lungs, other parts of body or loss of life  Patient voiced understanding and assent.)       Anesthesia Quick Evaluation

## 2024-01-14 NOTE — Op Note (Signed)
 OPERATIVE REPORT  Attending Physician: [name]. Rumalda, MD, MBA, FARS    Pediatric Otoloaryngology      Preoperative Diagnosis: Recurrent otitis media.  Postoperative Diagnosis: Same.   Procedure(s) Performed:   1. Tympanostomy tube insertion  (CPT [id number])  -  bilateral Duravent PE tubes 2. Use of operating microscope (CPT [phone])   Teaching Surgeon:  [name]. Rumalda, MD, MBA, FARS Assistants: None Dictated   Anesthesia:  General with mask ventilation Specimens:  None Drains:  Bilateral PE tubes Estimated Blood Loss:  None  Operative Findings: Right ear:  minimal middle ear effusion  Left ear:  minimal middle ear effusion   Procedure: After informed consent was obtained from the patient's parents, the patient was brought from the preoperative holding area to the operating room and placed supine on the operating room table. After smooth induction of general anesthesia with mask ventilation, a timeout was called and all parties were in agreement. The operating microscope was then brought into the field. A speculum was then used to visualize the right external auditory canal. Cerumen was removed and the tympanic membrane was ultimately visualized. A myringotomy was made in the anterior inferior portion of the tympanic membrane using a myringotomy knife.  Any middle ear fluid / effusion was removed with suction.  A pressure equalization tube was carefully placed and positioned with a pick. Floxin  drops were instilled and a cotton swab was used for occlusion. Attention was then turned to completion of the procedure on the contralateral side, which was done in a similar fashion. A speculum was then used to visualize the external auditory canal. Cerumen was removed. The tympanic membrane was ultimately visualized and a myringotomy was made in the anterior inferior portion of the tympanic membrane.  Any middle ear fluid / effusion was removed with suction.  A pressure equalization tube was carefully  placed on the left and positioned with a pick. Ciprofloxacin  drops were then instilled and a cotton ball placed.  The patient's care was turned over to the Anesthesia team who successfully awakened the patient without event. The patient was transported to the PACU in stable condition. All instrument, sharp and lap counts were correct at the end of the case.   Teaching Surgeon Attestation:  I was present and performed the entire procedure.    [name]. Rumalda, MD, MBA, [facility] Otolaryngology-Head & Neck Surgery [place] [facility] [phone]

## 2024-01-15 ENCOUNTER — Encounter: Payer: Self-pay | Admitting: Otolaryngology

## 2024-02-08 DIAGNOSIS — J309 Allergic rhinitis, unspecified: Secondary | ICD-10-CM | POA: Diagnosis not present

## 2024-02-08 DIAGNOSIS — H663X3 Other chronic suppurative otitis media, bilateral: Secondary | ICD-10-CM | POA: Diagnosis not present

## 2024-03-08 ENCOUNTER — Ambulatory Visit: Admitting: Podiatry

## 2024-04-11 ENCOUNTER — Ambulatory Visit: Admitting: Podiatry

## 2024-04-25 DIAGNOSIS — Z85828 Personal history of other malignant neoplasm of skin: Secondary | ICD-10-CM | POA: Diagnosis not present

## 2024-04-25 DIAGNOSIS — Z859 Personal history of malignant neoplasm, unspecified: Secondary | ICD-10-CM | POA: Diagnosis not present

## 2024-04-25 DIAGNOSIS — Z86018 Personal history of other benign neoplasm: Secondary | ICD-10-CM | POA: Diagnosis not present

## 2024-04-25 DIAGNOSIS — L578 Other skin changes due to chronic exposure to nonionizing radiation: Secondary | ICD-10-CM | POA: Diagnosis not present

## 2024-04-26 ENCOUNTER — Ambulatory Visit: Admitting: Podiatry

## 2024-04-26 ENCOUNTER — Encounter: Payer: Self-pay | Admitting: Podiatry

## 2024-04-26 VITALS — Ht 61.0 in | Wt 123.0 lb

## 2024-04-26 DIAGNOSIS — M2012 Hallux valgus (acquired), left foot: Secondary | ICD-10-CM | POA: Diagnosis not present

## 2024-04-26 NOTE — Progress Notes (Signed)
 Chief Complaint  Patient presents with   Bunions    Pt is here to discuss surgery for right foot bunion, states was schedule last year but was cancel.    Subjective: 53 y.o. female presents today for follow-up evaluation of a symptomatic bunion to the left foot ongoing for several years.  Progressive in nature.  She was originally scheduled for surgery last year however it was canceled.  She states that over the past year the bunion has all become worse and she is now ready to proceed with surgery.  Past Medical History:  Diagnosis Date   Complication of anesthesia    High cholesterol    PONV (postoperative nausea and vomiting)    Postmenopausal    Vaginal Pap smear, abnormal     Past Surgical History:  Procedure Laterality Date   ABDOMINAL HYSTERECTOMY  2004   BREAST BIOPSY Right    negative years ago   BREAST BIOPSY Left 04/2023   CORE NEEDLE BIOPSY   BREAST EXCISIONAL BIOPSY Right    CESAREAN SECTION     LEEP     MYRINGOTOMY WITH TUBE PLACEMENT Bilateral 01/14/2024   Procedure: MYRINGOTOMY WITH TUBE PLACEMENT;  Surgeon: Rumalda [name], MD;  Location: [facility];  Service: ENT;  Laterality: Bilateral;   TONSILLECTOMY  2002    Allergies  Allergen Reactions   Influenza Vaccines     Swelling in the throat  Swelling and pain at the site   Other Hives    Swelling in the throat  Swelling and pain at the site   Atorvastatin Other (See Comments)    LEG PAIN     Objective: Physical Exam General: The patient is alert and oriented x3 in no acute distress.  Dermatology: Skin is cool, dry and supple bilateral lower extremities. Negative for open lesions or macerations.  Vascular: Palpable pedal pulses bilaterally. No edema or erythema noted. Capillary refill within normal limits.  Neurological: Grossly intact via light touch  Musculoskeletal Exam: Symptomatic bunion deformity noted to the respective foot. There is moderate pain on palpation to the medial  eminence of the first MTP.  Radiographic Exam LT foot 12/26/2022: Increased intermetatarsal angle with medial deviation of the first metatarsal and hypertrophic medial eminence consistent with bunion deformity  Assessment: 1.  Hallux valgus left   Plan of Care:  -Patient was evaluated. X-Rays reviewed.  Patient is now ready proceed with surgery that was originally scheduled last year -Today again we discussed the pathology and etiology of bunion deformities.  Discussed different treatment options both conservative and surgical.  Unfortunately patient has failed conservative treatment and alleviating pain associated to her bunions.  She says that she is at the point that she would like to discuss and consider surgery.  Surgery was discussed in detail to the patient specifically regarding the Lapidus type bunionectomy.  Details of the procedure as well as risk benefits advantages and disadvantages were explained.  No guarantees were expressed or implied.  All patient questions were answered.  The patient consented and would like to proceed with surgery at this time this summer -Authorization for surgery was initiated today.  Surgery will consist of bunionectomy with distal first metatarsal osteotomy left -Return to clinic 1 week postop  *Does not work, but takes care of her grandchildren and also has an 41 yr old adopted daughter at home   [name], DPM [facility]  Dr. [name], DPM    [address]  [address], [place] [address]                Office [phone]  Fax [phone]

## 2024-05-03 ENCOUNTER — Telehealth: Payer: Self-pay | Admitting: Podiatry

## 2024-05-03 NOTE — Telephone Encounter (Signed)
 Called patient and got her scheduled for surgery on 06/02/2024. Patient confirmed receipt of surgical bag and is aware [facility] will call 24-48 hr prior with arrival time. Not on any blood thinners or GLP1 meds. Confirmed preferred pharmacy in chart is correct.

## 2024-05-04 ENCOUNTER — Telehealth: Payer: Self-pay | Admitting: Podiatry

## 2024-05-04 NOTE — Telephone Encounter (Signed)
 DOS- 06/02/2024  [place] BUNIONECTOMY LT- 71703  [employer] EFFECTIVE DATE- 07/22/2023  DEDUCTIBLE- $5000 REMAINING- $0 OOP- $8050 REMAINING- $5111.62 COINSURANCE- 30%  PER CARELON PORTAL, PRIOR AUTH FOR CPT CODE 71703 HAS BEEN APPROVED FROM 06/02/2024-07/31/2024. AUTH# [id number]

## 2024-06-02 ENCOUNTER — Other Ambulatory Visit: Payer: Self-pay | Admitting: Podiatry

## 2024-06-02 DIAGNOSIS — M21612 Bunion of left foot: Secondary | ICD-10-CM | POA: Diagnosis not present

## 2024-06-02 DIAGNOSIS — M2012 Hallux valgus (acquired), left foot: Secondary | ICD-10-CM | POA: Diagnosis not present

## 2024-06-02 DIAGNOSIS — G8918 Other acute postprocedural pain: Secondary | ICD-10-CM | POA: Diagnosis not present

## 2024-06-02 MED ORDER — IBUPROFEN 800 MG PO TABS: 800.0000 mg | ORAL_TABLET | Freq: Three times a day (TID) | ORAL | 1 refills | Status: AC

## 2024-06-02 MED ORDER — OXYCODONE-ACETAMINOPHEN 5-325 MG PO TABS: 1.0000 | ORAL_TABLET | ORAL | 0 refills | Status: AC | PRN

## 2024-06-02 NOTE — Progress Notes (Signed)
 PRN postop

## 2024-06-10 ENCOUNTER — Ambulatory Visit: Admitting: Podiatry

## 2024-06-10 ENCOUNTER — Encounter: Payer: Self-pay | Admitting: Podiatry

## 2024-06-10 ENCOUNTER — Ambulatory Visit

## 2024-06-10 VITALS — Ht 61.0 in | Wt 123.0 lb

## 2024-06-10 DIAGNOSIS — M2012 Hallux valgus (acquired), left foot: Secondary | ICD-10-CM

## 2024-06-10 NOTE — Progress Notes (Unsigned)
   No chief complaint on file.   Subjective:  Patient presents today status post bunionectomy with distal first metatarsal osteotomy of the left foot.  DOS: 06/02/2024.  Doing well.  Minimal WBAT in the cam boot as instructed.  No new complaints  Past Medical History:  Diagnosis Date   Complication of anesthesia    High cholesterol    PONV (postoperative nausea and vomiting)    Postmenopausal    Vaginal Pap smear, abnormal     Past Surgical History:  Procedure Laterality Date   ABDOMINAL HYSTERECTOMY  2004   BREAST BIOPSY Right    negative years ago   BREAST BIOPSY Left 04/2023   CORE NEEDLE BIOPSY   BREAST EXCISIONAL BIOPSY Right    CESAREAN SECTION     LEEP     MYRINGOTOMY WITH TUBE PLACEMENT Bilateral 01/14/2024   Procedure: MYRINGOTOMY WITH TUBE PLACEMENT;  Surgeon: Rumalda [name], MD;  Location: [facility];  Service: ENT;  Laterality: Bilateral;   TONSILLECTOMY  2002    Allergies  Allergen Reactions   Influenza Vaccines     Swelling in the throat  Swelling and pain at the site   Other Hives    Swelling in the throat  Swelling and pain at the site   Atorvastatin Other (See Comments)    LEG PAIN    Objective/Physical Exam Neurovascular status intact.  Incision well coapted with sutures intact. No sign of infectious process noted. No dehiscence. No active bleeding noted.  Moderate edema noted to the surgical extremity.  Radiographic Exam LT foot 06/10/2024:  Orthopedic screws to the first metatarsal appear stable and intact.  No complicating features.  Overall good alignment of the first ray  Assessment: 1. s/p bunionectomy with distal first metatarsal osteotomy left. DOS: 06/02/2024   Plan of Care:  -Patient was evaluated. X-rays reviewed - Okay to wash and shower and get the foot wet -Supplies provided to redress the foot as needed -Continue minimal WBAT CAM boot -Return to clinic 10 days suture removal   Amanda Adams, DPM [facility]  Dr. Thresa EMERSON Adams, DPM    [address] [place], [place] [address]                Office [phone]  Fax [phone]

## 2024-06-21 ENCOUNTER — Encounter: Payer: Self-pay | Admitting: Podiatry

## 2024-06-21 ENCOUNTER — Ambulatory Visit (INDEPENDENT_AMBULATORY_CARE_PROVIDER_SITE_OTHER): Admitting: Podiatry

## 2024-06-21 VITALS — Ht 61.0 in | Wt 123.0 lb

## 2024-06-21 DIAGNOSIS — M2012 Hallux valgus (acquired), left foot: Secondary | ICD-10-CM

## 2024-06-21 NOTE — Progress Notes (Signed)
   Chief Complaint  Patient presents with   Routine Post Op    **form from surgery center for cam boot**POV #2 DOS 06/02/2024 LT [place] BUNIONECTOMY, She has returned to work this week. She has some swelling and a pain scale of 3/10    Subjective:  Patient presents today status post bunionectomy with distal first metatarsal osteotomy of the left foot.  DOS: 06/02/2024.  Doing well.  No new complaints  Past Medical History:  Diagnosis Date   Complication of anesthesia    High cholesterol    PONV (postoperative nausea and vomiting)    Postmenopausal    Vaginal Pap smear, abnormal     Past Surgical History:  Procedure Laterality Date   ABDOMINAL HYSTERECTOMY  2004   BREAST BIOPSY Right    negative years ago   BREAST BIOPSY Left 04/2023   CORE NEEDLE BIOPSY   BREAST EXCISIONAL BIOPSY Right    CESAREAN SECTION     LEEP     MYRINGOTOMY WITH TUBE PLACEMENT Bilateral 01/14/2024   Procedure: MYRINGOTOMY WITH TUBE PLACEMENT;  Surgeon: Rumalda [name], MD;  Location: [facility];  Service: ENT;  Laterality: Bilateral;   TONSILLECTOMY  2002    Allergies  Allergen Reactions   Influenza Vaccines     Swelling in the throat  Swelling and pain at the site   Other Hives    Swelling in the throat  Swelling and pain at the site   Atorvastatin Other (See Comments)    LEG PAIN    Objective/Physical Exam Neurovascular status intact.  Incision well coapted with sutures intact. No sign of infectious process noted. No dehiscence. No active bleeding noted.  Moderate edema noted to the surgical extremity.  Radiographic Exam LT foot 06/10/2024:  Orthopedic screws to the first metatarsal appear stable and intact.  No complicating features.  Overall good alignment of the first ray  Assessment: 1. s/p bunionectomy with distal first metatarsal osteotomy left. DOS: 06/02/2024   Plan of Care:  -Patient was evaluated.  Sutures removed -Compression ankle sleeve dispensed -WBAT CAM boot -  Return to clinic 2 weeks follow-up x-ray and to transition the patient out of the cam boot into good supportive tennis shoes   [name], DPM [facility]  Dr. [name], DPM    [address] [place], [place] [address]                Office [phone]  Fax [phone]

## 2024-07-05 ENCOUNTER — Ambulatory Visit (INDEPENDENT_AMBULATORY_CARE_PROVIDER_SITE_OTHER): Admitting: Podiatry

## 2024-07-05 ENCOUNTER — Encounter: Payer: Self-pay | Admitting: Podiatry

## 2024-07-05 ENCOUNTER — Ambulatory Visit

## 2024-07-05 VITALS — Ht 61.0 in | Wt 123.0 lb

## 2024-07-05 DIAGNOSIS — M2012 Hallux valgus (acquired), left foot: Secondary | ICD-10-CM

## 2024-07-05 NOTE — Progress Notes (Signed)
° °  Chief Complaint  Patient presents with   Routine Post Op    POV #3 DOS 06/02/2024 LT [place] BUNIONECTOMY, pt is here to f/u on left foot after surgery she states everything is going well, no pain or complaints.    Subjective:  Patient presents today status post bunionectomy with distal first metatarsal osteotomy of the left foot.  DOS: 06/02/2024.  Doing well.  No new complaints  Past Medical History:  Diagnosis Date   Complication of anesthesia    High cholesterol    PONV (postoperative nausea and vomiting)    Postmenopausal    Vaginal Pap smear, abnormal     Past Surgical History:  Procedure Laterality Date   ABDOMINAL HYSTERECTOMY  2004   BREAST BIOPSY Right    negative years ago   BREAST BIOPSY Left 04/2023   CORE NEEDLE BIOPSY   BREAST EXCISIONAL BIOPSY Right    CESAREAN SECTION     LEEP     MYRINGOTOMY WITH TUBE PLACEMENT Bilateral 01/14/2024   Procedure: MYRINGOTOMY WITH TUBE PLACEMENT;  Surgeon: Rumalda [name], MD;  Location: [facility];  Service: ENT;  Laterality: Bilateral;   TONSILLECTOMY  2002    Allergies  Allergen Reactions   Influenza Vaccines     Swelling in the throat  Swelling and pain at the site   Other Hives    Swelling in the throat  Swelling and pain at the site   Atorvastatin Other (See Comments)    LEG PAIN    Objective/Physical Exam Neurovascular status intact.  Patient nicely healed.  Minimal edema noted.  There is some slight limited range of motion of first MTP  Radiographic Exam LT foot 07/05/2024:  Unchanged.  Orthopedic screws to the first metatarsal appear stable and intact.  No complicating features.  Overall good alignment of the first ray.  Assessment: 1. s/p bunionectomy with distal first metatarsal osteotomy left. DOS: 06/02/2024   Plan of Care:  -Patient was evaluated.  X-rays reviewed -Continue compression ankle sleeves -Okay to transition out of cam boot into good supportive tennis shoes and sneakers -Okay  to slowly increase activity in small increments -Return to clinic 8 weeks follow-up x-ray   [name], DPM [facility]  Dr. [name], DPM    [address] [place], [place] [address]                Office [phone]  Fax [phone]

## 2024-09-06 ENCOUNTER — Ambulatory Visit: Admitting: Podiatry
